# Patient Record
Sex: Male | Born: 1975
Health system: Southern US, Community
[De-identification: ages and names within clinical notes are randomized; demographics above are authoritative.]

## PROBLEM LIST (undated history)

## (undated) DIAGNOSIS — A630 Anogenital (venereal) warts: Secondary | ICD-10-CM

## (undated) DIAGNOSIS — B20 Human immunodeficiency virus [HIV] disease: Secondary | ICD-10-CM

## (undated) DIAGNOSIS — Z21 Asymptomatic human immunodeficiency virus [HIV] infection status: Secondary | ICD-10-CM

## (undated) DIAGNOSIS — I10 Essential (primary) hypertension: Secondary | ICD-10-CM

## (undated) DIAGNOSIS — B354 Tinea corporis: Secondary | ICD-10-CM

## (undated) DIAGNOSIS — R21 Rash and other nonspecific skin eruption: Secondary | ICD-10-CM

## (undated) DIAGNOSIS — B360 Pityriasis versicolor: Secondary | ICD-10-CM

## (undated) HISTORY — DX: Human immunodeficiency virus (HIV) disease: B20

## (undated) HISTORY — DX: Essential (primary) hypertension: I10

## (undated) HISTORY — DX: Tinea corporis: B35.4

## (undated) HISTORY — DX: Pityriasis versicolor: B36.0

## (undated) HISTORY — DX: Rash and other nonspecific skin eruption: R21

## (undated) HISTORY — DX: Asymptomatic human immunodeficiency virus (hiv) infection status: Z21

## (undated) HISTORY — PX: NO PAST SURGERIES: SHX2092

---

## 2008-01-08 ENCOUNTER — Emergency Department (HOSPITAL_COMMUNITY): Admission: EM | Admit: 2008-01-08 | Discharge: 2008-01-08 | Payer: Self-pay | Admitting: Emergency Medicine

## 2008-01-09 ENCOUNTER — Observation Stay (HOSPITAL_COMMUNITY): Admission: EM | Admit: 2008-01-09 | Discharge: 2008-01-10 | Payer: Self-pay | Admitting: Emergency Medicine

## 2008-01-12 ENCOUNTER — Ambulatory Visit (HOSPITAL_COMMUNITY): Admission: RE | Admit: 2008-01-12 | Discharge: 2008-01-12 | Payer: Self-pay | Admitting: Infectious Disease

## 2008-01-12 ENCOUNTER — Ambulatory Visit: Payer: Self-pay | Admitting: Infectious Disease

## 2008-01-12 ENCOUNTER — Encounter (INDEPENDENT_AMBULATORY_CARE_PROVIDER_SITE_OTHER): Payer: Self-pay | Admitting: Internal Medicine

## 2008-01-13 DIAGNOSIS — R748 Abnormal levels of other serum enzymes: Secondary | ICD-10-CM | POA: Insufficient documentation

## 2008-01-13 DIAGNOSIS — R079 Chest pain, unspecified: Secondary | ICD-10-CM

## 2008-01-13 DIAGNOSIS — J984 Other disorders of lung: Secondary | ICD-10-CM

## 2008-01-13 LAB — CONVERTED CEMR LAB
BUN: 10 mg/dL (ref 6–23)
CK-MB: 23.8 ng/mL — ABNORMAL HIGH (ref 0.3–4.0)
CO2: 29 meq/L (ref 19–32)
Calcium: 9.3 mg/dL (ref 8.4–10.5)
Chloride: 100 meq/L (ref 96–112)
Creatinine, Ser: 0.92 mg/dL (ref 0.40–1.50)
Glucose, Bld: 84 mg/dL (ref 70–99)
Potassium: 3.4 meq/L — ABNORMAL LOW (ref 3.5–5.3)
Relative Index: 2.1 (ref 0.0–2.5)
Sodium: 135 meq/L (ref 135–145)
Total CK: 1127 units/L — ABNORMAL HIGH (ref 7–232)
Troponin I: <0.01 ng/mL (ref <0.06)

## 2008-01-19 ENCOUNTER — Ambulatory Visit: Payer: Self-pay | Admitting: Internal Medicine

## 2008-01-19 ENCOUNTER — Encounter (INDEPENDENT_AMBULATORY_CARE_PROVIDER_SITE_OTHER): Payer: Self-pay | Admitting: Internal Medicine

## 2008-01-20 LAB — CONVERTED CEMR LAB
BUN: 20 mg/dL (ref 6–23)
CK-MB: 24.6 ng/mL — ABNORMAL HIGH (ref 0.3–4.0)
CO2: 29 meq/L (ref 19–32)
Calcium: 8.9 mg/dL (ref 8.4–10.5)
Chloride: 100 meq/L (ref 96–112)
Creatinine, Ser: 0.83 mg/dL (ref 0.40–1.50)
Glucose, Bld: 103 mg/dL — ABNORMAL HIGH (ref 70–99)
Potassium: 3.6 meq/L (ref 3.5–5.3)
Relative Index: 1.7 (ref 0.0–2.5)
Sodium: 137 meq/L (ref 135–145)
Total CK: 1465 units/L — ABNORMAL HIGH (ref 7–232)
Troponin I: <0.01 ng/mL (ref <0.06)

## 2008-01-28 ENCOUNTER — Ambulatory Visit: Payer: Self-pay | Admitting: Internal Medicine

## 2008-03-24 ENCOUNTER — Ambulatory Visit: Payer: Self-pay | Admitting: Internal Medicine

## 2008-03-24 ENCOUNTER — Encounter: Payer: Self-pay | Admitting: Internal Medicine

## 2008-03-28 LAB — CONVERTED CEMR LAB
ALT: 26 units/L (ref 0–53)
AST: 46 units/L — ABNORMAL HIGH (ref 0–37)
Albumin: 3.7 g/dL (ref 3.5–5.2)
Alkaline Phosphatase: 59 units/L (ref 39–117)
BUN: 15 mg/dL (ref 6–23)
CK-MB: 15.6 ng/mL — ABNORMAL HIGH (ref 0.3–4.0)
CO2: 28 meq/L (ref 19–32)
Calcium: 8.9 mg/dL (ref 8.4–10.5)
Chloride: 97 meq/L (ref 96–112)
Creatinine, Ser: 1 mg/dL (ref 0.40–1.50)
Glucose, Bld: 84 mg/dL (ref 70–99)
Potassium: 3.5 meq/L (ref 3.5–5.3)
Relative Index: 1.6 (ref 0.0–2.5)
Sodium: 132 meq/L — ABNORMAL LOW (ref 135–145)
Total Bilirubin: 0.7 mg/dL (ref 0.3–1.2)
Total CK: 947 units/L — ABNORMAL HIGH (ref 7–232)
Total Protein: 9 g/dL — ABNORMAL HIGH (ref 6.0–8.3)

## 2008-05-13 ENCOUNTER — Ambulatory Visit: Payer: Self-pay | Admitting: Internal Medicine

## 2008-05-13 ENCOUNTER — Encounter (INDEPENDENT_AMBULATORY_CARE_PROVIDER_SITE_OTHER): Payer: Self-pay | Admitting: Internal Medicine

## 2008-05-20 LAB — CONVERTED CEMR LAB
BUN: 10 mg/dL (ref 6–23)
CK-MB: 22.6 ng/mL — ABNORMAL HIGH (ref 0.3–4.0)
CO2: 26 meq/L (ref 19–32)
Calcium: 9.5 mg/dL (ref 8.4–10.5)
Chloride: 102 meq/L (ref 96–112)
Creatinine, Ser: 0.9 mg/dL (ref 0.40–1.50)
Glucose, Bld: 95 mg/dL (ref 70–99)
Potassium: 3.6 meq/L (ref 3.5–5.3)
Relative Index: 1.6 (ref 0.0–2.5)
Sodium: 137 meq/L (ref 135–145)
Total CK: 1392 units/L — ABNORMAL HIGH (ref 7–232)

## 2008-08-15 ENCOUNTER — Encounter (INDEPENDENT_AMBULATORY_CARE_PROVIDER_SITE_OTHER): Payer: Self-pay | Admitting: Internal Medicine

## 2008-08-15 ENCOUNTER — Ambulatory Visit: Payer: Self-pay | Admitting: Internal Medicine

## 2008-08-15 LAB — CONVERTED CEMR LAB
BUN: 31 mg/dL — ABNORMAL HIGH (ref 6–23)
CO2: 23 meq/L (ref 19–32)
Calcium: 8.8 mg/dL (ref 8.4–10.5)
Chloride: 98 meq/L (ref 96–112)
Creatinine, Ser: 1.17 mg/dL (ref 0.40–1.50)
Glucose, Bld: 82 mg/dL (ref 70–99)
Potassium: 3.9 meq/L (ref 3.5–5.3)
Sodium: 135 meq/L (ref 135–145)
Total CK: 2150 units/L — ABNORMAL HIGH (ref 7–232)

## 2009-07-10 ENCOUNTER — Ambulatory Visit: Payer: Self-pay | Admitting: Internal Medicine

## 2009-07-10 LAB — CONVERTED CEMR LAB
BUN: 13 mg/dL (ref 6–23)
CO2: 22 meq/L (ref 19–32)
Calcium: 8.8 mg/dL (ref 8.4–10.5)
Chloride: 101 meq/L (ref 96–112)
Creatinine, Ser: 0.94 mg/dL (ref 0.40–1.50)
Glucose, Bld: 86 mg/dL (ref 70–99)
Potassium: 3.8 meq/L (ref 3.5–5.3)
Sodium: 136 meq/L (ref 135–145)

## 2009-07-11 ENCOUNTER — Telehealth: Payer: Self-pay | Admitting: *Deleted

## 2009-08-10 IMAGING — CT CT ANGIO CHEST
2 of 5 series · 19 of 36 positions shown · IV contrast (100 ML OMNI 300)
Comparison: None available

CLINICAL DATA: Chest pain

CT ANGIOGRAPHY CHEST
TECHNIQUE: Multidetector CT imaging of the chest using the
standard protocol during bolus administration of intravenous
contrast. Multiplanar reconstructed images obtained and reviewed to
evaluate the vascular anatomy.
Contrast: 100 ml [id number] IV

[Series 3: pe · axial · 0.64mm/px · z∈[-362,-89]mm · 16 of 502 slices shown]
[im 24/502  lung]
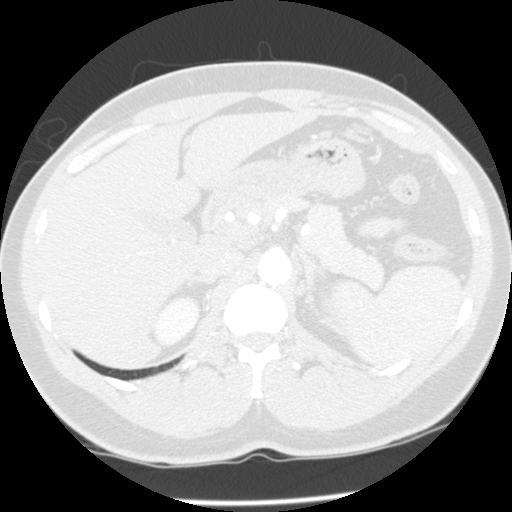
[im 48/502  mediastinal]
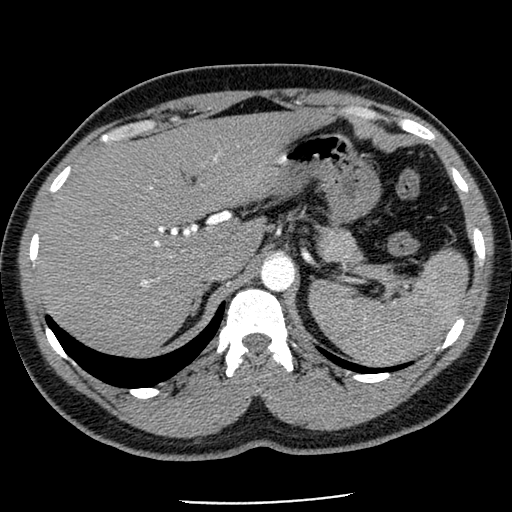
[im 96/502  lung]
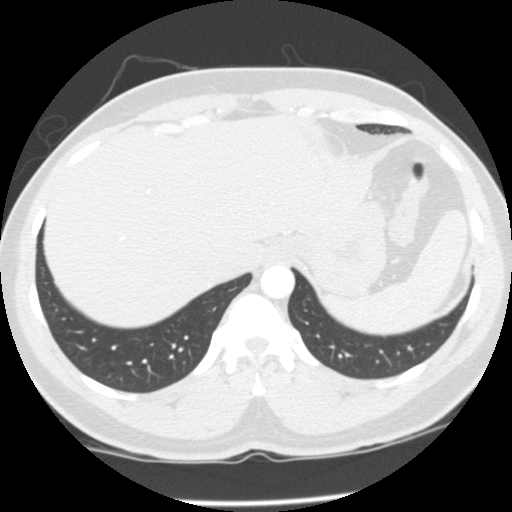
[im 120/502  mediastinal]
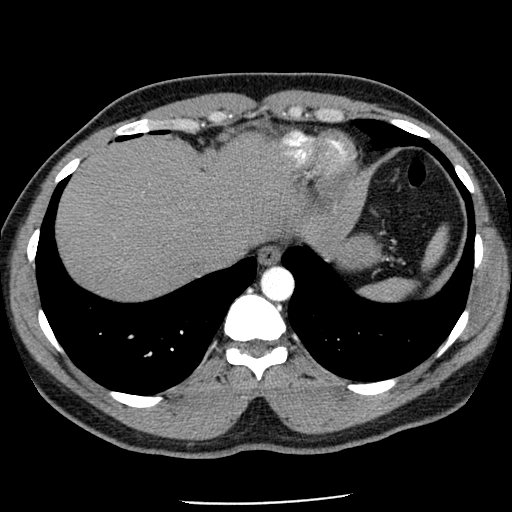
[im 144/502  lung]
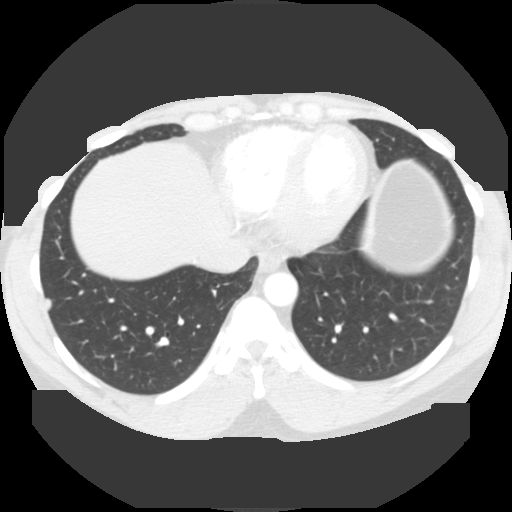
[im 168/502  mediastinal]
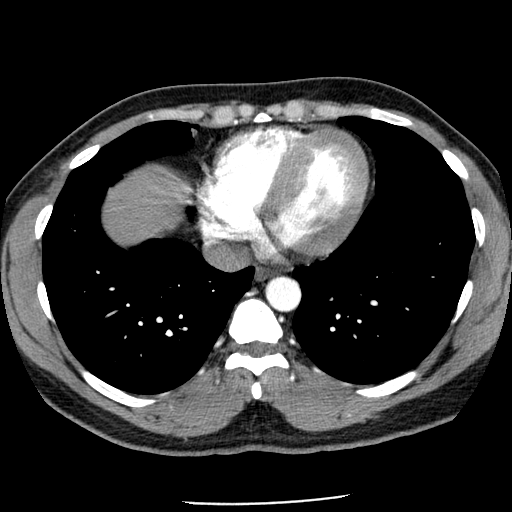
[im 215/502  lung]
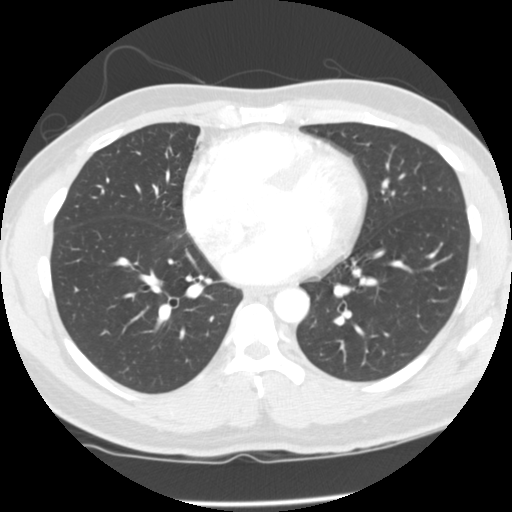
[im 239/502  mediastinal]
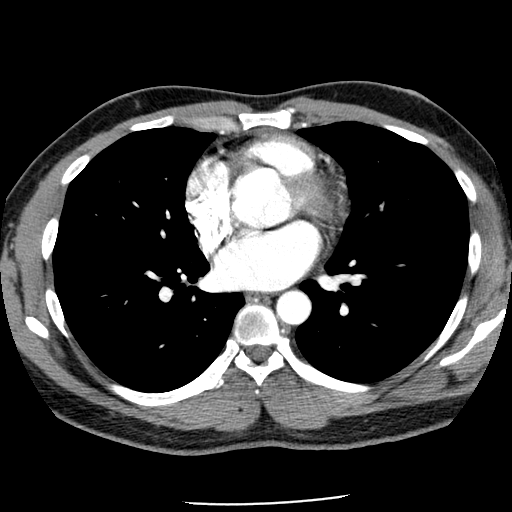
[im 263/502  lung]
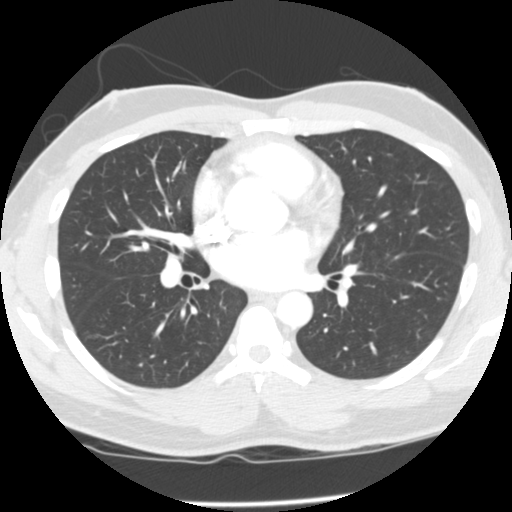
[im 287/502  mediastinal]
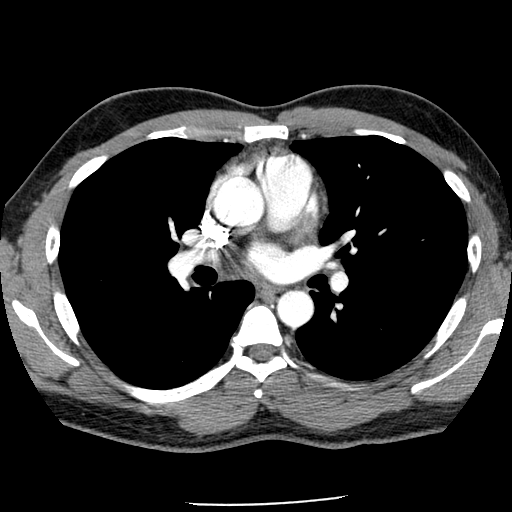
[im 335/502  lung]
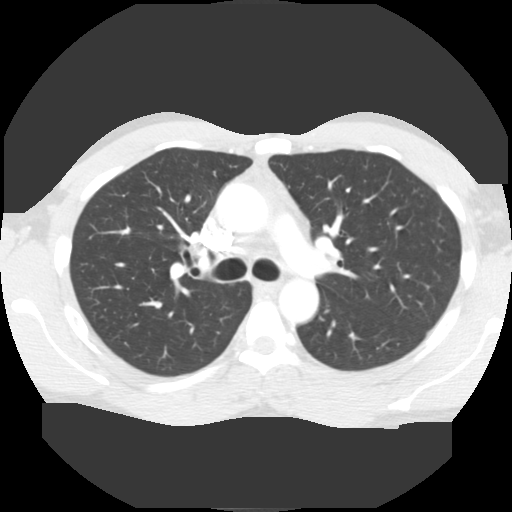
[im 358/502  mediastinal]
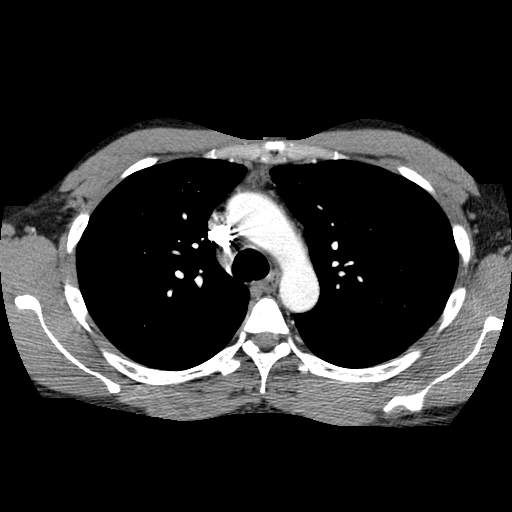
[im 382/502  lung]
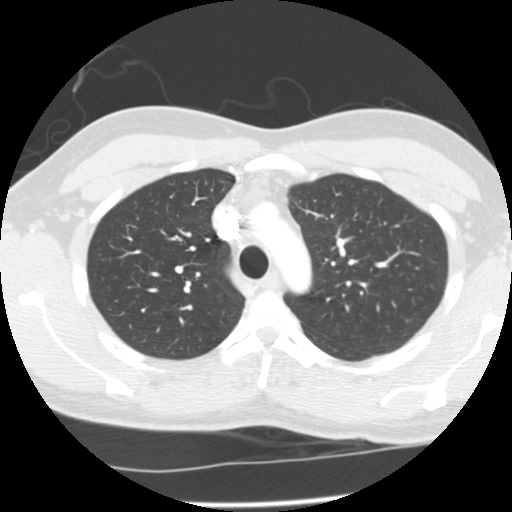
[im 406/502  mediastinal]
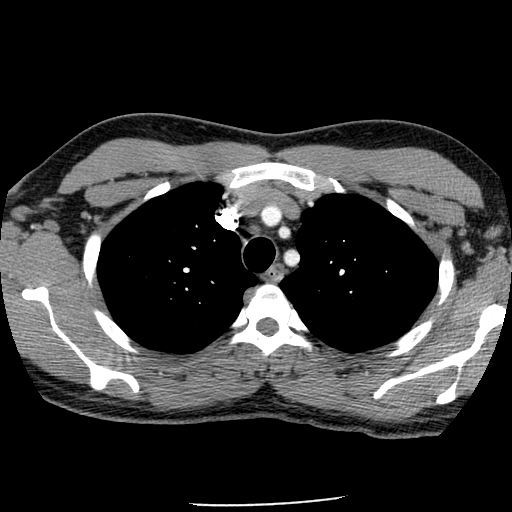
[im 454/502  lung]
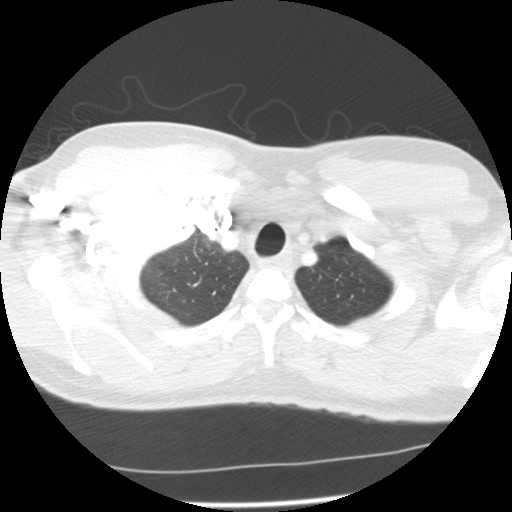
[im 478/502  mediastinal]
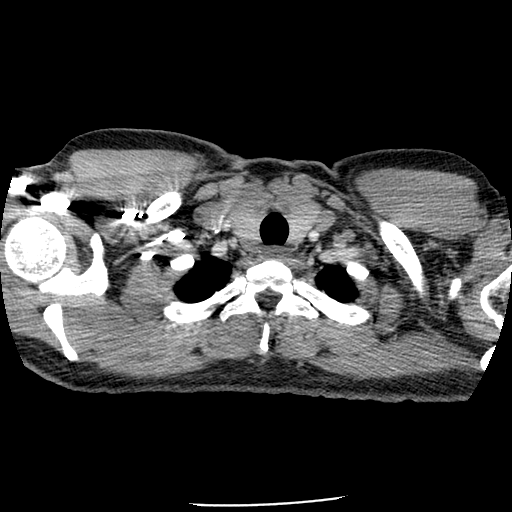

[Series 300: cor chest · coronal · 0.64mm/px · 3 of 152 slices shown]
[im 31/152  mediastinal]
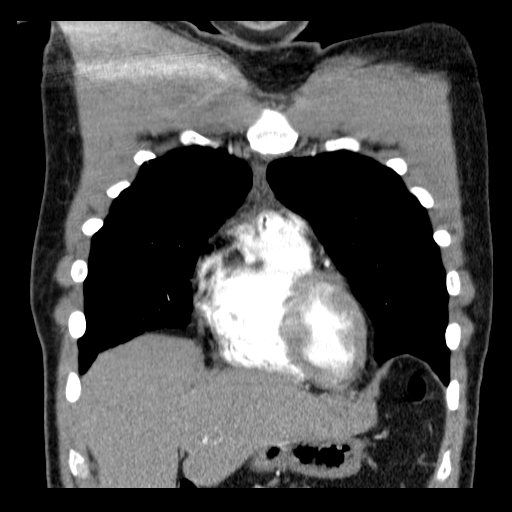
[im 61/152  mediastinal]
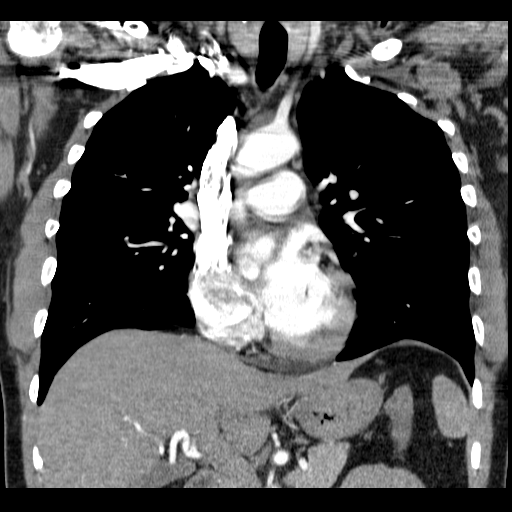
[im 91/152  mediastinal]
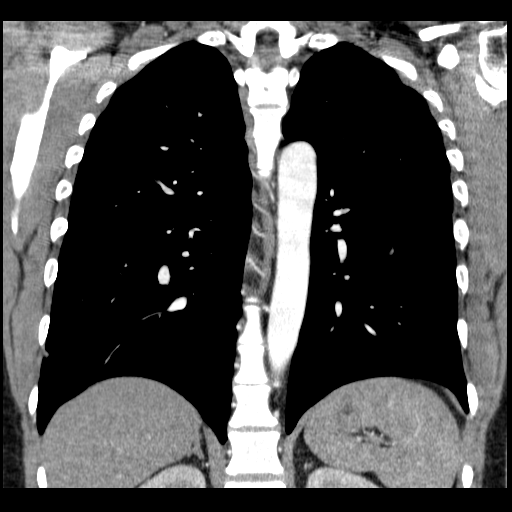

[19 of 36 positions shown; findings below may reference images not displayed]

FINDINGS: There is good contrast opacification of pulmonary artery
branches.  No discrete filling defects to suggest acute PE.  There
is good thoracic aortic opacification without evident abnormality.
No pleural or pericardial effusion.  Visualized upper abdomen
unremarkable.  No hilar or mediastinal adenopathy.  A few
subcentimeter prevascular lymph nodes are noted.  Nonspecific   6
mm pleural-based nodule in the lateral basal segment right lower
lobe.  Lungs otherwise clear.
IMPRESSION: 1.  Negative for acute PE.
2.  Nonspecific 6 mm pleural-based right lower lobe nodule.  In the
absence of any previous studies, consider 12-month follow-up to
confirm stability.

## 2010-01-24 ENCOUNTER — Ambulatory Visit: Payer: Self-pay | Admitting: Internal Medicine

## 2010-01-26 ENCOUNTER — Ambulatory Visit (HOSPITAL_COMMUNITY): Admission: RE | Admit: 2010-01-26 | Discharge: 2010-01-26 | Payer: Self-pay | Admitting: Internal Medicine

## 2010-01-26 ENCOUNTER — Encounter (INDEPENDENT_AMBULATORY_CARE_PROVIDER_SITE_OTHER): Payer: Self-pay | Admitting: Internal Medicine

## 2010-01-26 DIAGNOSIS — I10 Essential (primary) hypertension: Secondary | ICD-10-CM | POA: Insufficient documentation

## 2010-06-04 ENCOUNTER — Ambulatory Visit: Payer: Self-pay | Admitting: Internal Medicine

## 2010-06-04 ENCOUNTER — Encounter: Payer: Self-pay | Admitting: Internal Medicine

## 2010-06-04 DIAGNOSIS — R599 Enlarged lymph nodes, unspecified: Secondary | ICD-10-CM | POA: Insufficient documentation

## 2010-06-04 LAB — CONVERTED CEMR LAB
HCV Ab: NEGATIVE
Hep A IgM: NEGATIVE
Hep B C IgM: NEGATIVE
Hepatitis B Surface Ag: NEGATIVE
Sed Rate: 15 mm/hr (ref 0–16)
TSH: 0.95 microintl units/mL (ref 0.350–4.5)

## 2010-06-10 LAB — CONVERTED CEMR LAB
Basophils Absolute: 0.1 10*3/uL (ref 0.0–0.1)
Basophils Relative: 2 % — ABNORMAL HIGH (ref 0–1)
Eosinophils Absolute: 0.1 10*3/uL (ref 0.0–0.7)
Eosinophils Relative: 5 % (ref 0–5)
HCT: 40.2 % (ref 39.0–52.0)
HIV-1 antibody: POSITIVE — AB
HIV-2 Ab: NEGATIVE
HIV: REACTIVE
Hemoglobin: 13.7 g/dL (ref 13.0–17.0)
Lymphocytes Relative: 30 % (ref 12–46)
Lymphs Abs: 0.9 10*3/uL (ref 0.7–4.0)
MCHC: 34.1 g/dL (ref 30.0–36.0)
MCV: 81.4 fL (ref >=78.0)
Monocytes Absolute: 0.4 10*3/uL (ref 0.1–1.0)
Monocytes Relative: 12 % (ref 3–12)
Neutro Abs: 1.5 10*3/uL — ABNORMAL LOW (ref 1.7–7.7)
Neutrophils Relative %: 51 % (ref 43–77)
Platelets: 214 10*3/uL (ref 150–400)
RBC: 4.94 M/uL (ref 4.22–5.81)
RDW: 16.8 % — ABNORMAL HIGH (ref 11.5–15.5)
WBC: 2.9 10*3/uL — ABNORMAL LOW (ref 4.0–10.5)

## 2010-07-12 ENCOUNTER — Telehealth: Payer: Self-pay | Admitting: Internal Medicine

## 2010-07-12 ENCOUNTER — Ambulatory Visit: Payer: Self-pay | Admitting: Internal Medicine

## 2010-07-12 DIAGNOSIS — B20 Human immunodeficiency virus [HIV] disease: Secondary | ICD-10-CM | POA: Insufficient documentation

## 2010-07-13 ENCOUNTER — Telehealth: Payer: Self-pay | Admitting: Internal Medicine

## 2010-07-13 ENCOUNTER — Encounter: Payer: Self-pay | Admitting: Internal Medicine

## 2010-07-13 LAB — CONVERTED CEMR LAB
ALT: 30 units/L (ref 0–53)
AST: 73 units/L — ABNORMAL HIGH (ref 0–37)
Albumin: 4.2 g/dL (ref 3.5–5.2)
Alkaline Phosphatase: 60 units/L (ref 39–117)
BUN: 15 mg/dL (ref 6–23)
CO2: 26 meq/L (ref 19–32)
Calcium: 9.3 mg/dL (ref 8.4–10.5)
Chloride: 98 meq/L (ref 96–112)
Cholesterol: 114 mg/dL (ref 0–200)
Creatinine, Ser: 1.21 mg/dL (ref 0.40–1.50)
Glucose, Bld: 91 mg/dL (ref 70–99)
HDL: 25 mg/dL — ABNORMAL LOW (ref >39)
HIV 1 RNA Quant: 164000 copies/mL — ABNORMAL HIGH (ref <20)
HIV-1 RNA Quant, Log: 5.21 — ABNORMAL HIGH (ref <1.30)
Hep B S Ab: POSITIVE — AB
LDL Cholesterol: 75 mg/dL (ref 0–99)
Potassium: 3.5 meq/L (ref 3.5–5.3)
Sodium: 135 meq/L (ref 135–145)
Total Bilirubin: 0.5 mg/dL (ref 0.3–1.2)
Total CHOL/HDL Ratio: 4.6
Total Protein: 9.1 g/dL — ABNORMAL HIGH (ref 6.0–8.3)
Triglycerides: 68 mg/dL (ref <150)
VLDL: 14 mg/dL (ref 0–40)

## 2010-07-16 ENCOUNTER — Telehealth: Payer: Self-pay | Admitting: Internal Medicine

## 2010-07-19 ENCOUNTER — Ambulatory Visit: Payer: Self-pay | Admitting: Adult Health

## 2010-07-19 ENCOUNTER — Encounter: Payer: Self-pay | Admitting: Internal Medicine

## 2010-07-19 DIAGNOSIS — L219 Seborrheic dermatitis, unspecified: Secondary | ICD-10-CM | POA: Insufficient documentation

## 2010-07-19 LAB — CONVERTED CEMR LAB: Hep A Total Ab: NEGATIVE

## 2010-07-24 ENCOUNTER — Encounter: Payer: Self-pay | Admitting: Adult Health

## 2010-08-09 ENCOUNTER — Ambulatory Visit: Payer: Self-pay | Admitting: Adult Health

## 2010-08-09 ENCOUNTER — Encounter: Payer: Self-pay | Admitting: Internal Medicine

## 2010-08-09 DIAGNOSIS — M6282 Rhabdomyolysis: Secondary | ICD-10-CM

## 2010-08-09 DIAGNOSIS — B20 Human immunodeficiency virus [HIV] disease: Secondary | ICD-10-CM

## 2010-08-09 DIAGNOSIS — N179 Acute kidney failure, unspecified: Secondary | ICD-10-CM

## 2010-08-10 LAB — CONVERTED CEMR LAB
ALT: 61 units/L — ABNORMAL HIGH (ref 0–53)
AST: 111 units/L — ABNORMAL HIGH (ref 0–37)
Albumin: 3.6 g/dL (ref 3.5–5.2)
Alkaline Phosphatase: 42 units/L (ref 39–117)
BUN: 18 mg/dL (ref 6–23)
Basophils Absolute: 0 10*3/uL (ref 0.0–0.1)
Basophils Relative: 0 % (ref 0–1)
Bilirubin Urine: NEGATIVE
CO2: 25 meq/L (ref 19–32)
Calcium: 8.8 mg/dL (ref 8.4–10.5)
Casts: NONE SEEN /lpf
Chloride: 102 meq/L (ref 96–112)
Creatinine, Ser: 1.42 mg/dL (ref 0.40–1.50)
Creatinine, Urine: 249.7 mg/dL
Crystals: NONE SEEN
Eosinophils Absolute: 0 10*3/uL (ref 0.0–0.7)
Eosinophils Relative: 0 % (ref 0–5)
Glucose, Bld: 96 mg/dL (ref 70–99)
HCT: 37.3 % — ABNORMAL LOW (ref 39.0–52.0)
Hemoglobin: 13.2 g/dL (ref 13.0–17.0)
Ketones, ur: NEGATIVE mg/dL
Leukocytes, UA: NEGATIVE
Lymphocytes Relative: 30 % (ref 12–46)
Lymphs Abs: 1.2 10*3/uL (ref 0.7–4.0)
MCHC: 35.4 g/dL (ref 30.0–36.0)
MCV: 78.9 fL (ref 78.0–100.0)
Monocytes Absolute: 0.5 10*3/uL (ref 0.1–1.0)
Monocytes Relative: 12 % (ref 3–12)
Neutro Abs: 2.2 10*3/uL (ref 1.7–7.7)
Neutrophils Relative %: 58 % (ref 43–77)
Nitrite: NEGATIVE
Platelets: 270 10*3/uL (ref 150–400)
Potassium: 4.7 meq/L (ref 3.5–5.3)
Protein, ur: 30 mg/dL — AB
RBC: 4.73 M/uL (ref 4.22–5.81)
RDW: 16.9 % — ABNORMAL HIGH (ref 11.5–15.5)
Sodium: 136 meq/L (ref 135–145)
Specific Gravity, Urine: 1.021 (ref 1.005–1.030)
Squamous Epithelial / HPF: NONE SEEN /lpf
Total Bilirubin: 0.4 mg/dL (ref 0.3–1.2)
Total Protein, Urine: 54
Total Protein: 7.2 g/dL (ref 6.0–8.3)
Urine Glucose: NEGATIVE mg/dL
Urobilinogen, UA: 0.2 (ref 0.0–1.0)
WBC: 3.9 10*3/uL — ABNORMAL LOW (ref 4.0–10.5)
pH: 6 (ref 5.0–8.0)

## 2010-08-13 ENCOUNTER — Ambulatory Visit: Payer: Self-pay | Admitting: Adult Health

## 2010-08-22 ENCOUNTER — Telehealth: Payer: Self-pay | Admitting: Adult Health

## 2010-08-29 ENCOUNTER — Ambulatory Visit
Admission: RE | Admit: 2010-08-29 | Discharge: 2010-08-29 | Payer: Self-pay | Source: Home / Self Care | Attending: Adult Health | Admitting: Adult Health

## 2010-08-29 DIAGNOSIS — G609 Hereditary and idiopathic neuropathy, unspecified: Secondary | ICD-10-CM | POA: Insufficient documentation

## 2010-09-07 ENCOUNTER — Encounter (INDEPENDENT_AMBULATORY_CARE_PROVIDER_SITE_OTHER): Payer: Self-pay | Admitting: *Deleted

## 2010-09-14 ENCOUNTER — Ambulatory Visit
Admission: RE | Admit: 2010-09-14 | Discharge: 2010-09-14 | Payer: Self-pay | Source: Home / Self Care | Attending: Adult Health | Admitting: Adult Health

## 2010-09-14 ENCOUNTER — Encounter: Payer: Self-pay | Admitting: Adult Health

## 2010-09-14 LAB — CONVERTED CEMR LAB
Bilirubin Urine: NEGATIVE
Blood, UA: NEGATIVE
HIV 1 RNA Quant: 718 copies/mL — ABNORMAL HIGH (ref <20)
HIV-1 RNA Quant, Log: 2.86 — ABNORMAL HIGH (ref <1.30)
Ketones, ur: NEGATIVE mg/dL
Leukocytes, UA: NEGATIVE
Nitrite: NEGATIVE
Protein, ur: NEGATIVE mg/dL
Specific Gravity, Urine: 1.018 (ref 1.005–1.030)
Urine Glucose: NEGATIVE mg/dL
Urobilinogen, UA: 0.2 (ref 0.0–1.0)
pH: 6 (ref 5.0–8.0)

## 2010-09-17 LAB — CONVERTED CEMR LAB
ALT: 16 units/L (ref 0–53)
AST: 30 units/L (ref 0–37)
Albumin: 3.7 g/dL (ref 3.5–5.2)
Alkaline Phosphatase: 46 units/L (ref 39–117)
BUN: 13 mg/dL (ref 6–23)
Basophils Absolute: 0 10*3/uL (ref 0.0–0.1)
Basophils Relative: 1 % (ref 0–1)
CO2: 24 meq/L (ref 19–32)
Calcium: 9 mg/dL (ref 8.4–10.5)
Chloride: 105 meq/L (ref 96–112)
Creatinine, Ser: 1.67 mg/dL — ABNORMAL HIGH (ref 0.40–1.50)
Eosinophils Absolute: 0 10*3/uL (ref 0.0–0.7)
Eosinophils Relative: 0 % (ref 0–5)
Glucose, Bld: 73 mg/dL (ref 70–99)
HCT: 32.9 % — ABNORMAL LOW (ref 39.0–52.0)
Hemoglobin: 11.3 g/dL — ABNORMAL LOW (ref 13.0–17.0)
Lymphocytes Relative: 26 % (ref 12–46)
Lymphs Abs: 0.8 10*3/uL (ref 0.7–4.0)
MCHC: 34.3 g/dL (ref 30.0–36.0)
MCV: 79.9 fL (ref 78.0–100.0)
Monocytes Absolute: 0.4 10*3/uL (ref 0.1–1.0)
Monocytes Relative: 12 % (ref 3–12)
Neutro Abs: 1.9 10*3/uL (ref 1.7–7.7)
Neutrophils Relative %: 62 % (ref 43–77)
Platelets: 228 10*3/uL (ref 150–400)
Potassium: 4 meq/L (ref 3.5–5.3)
RBC: 4.12 M/uL — ABNORMAL LOW (ref 4.22–5.81)
RDW: 18 % — ABNORMAL HIGH (ref 11.5–15.5)
Sodium: 138 meq/L (ref 135–145)
Total Bilirubin: 0.2 mg/dL — ABNORMAL LOW (ref 0.3–1.2)
Total CK: 403 units/L — ABNORMAL HIGH (ref 7–232)
Total Protein: 7 g/dL (ref 6.0–8.3)
WBC: 3.1 10*3/uL — ABNORMAL LOW (ref 4.0–10.5)

## 2010-09-17 LAB — T-HELPER CELL (CD4) - (RCID CLINIC ONLY)
CD4 % Helper T Cell: 4 % — ABNORMAL LOW (ref 33–55)
CD4 T Cell Abs: 30 uL — ABNORMAL LOW (ref 400–2700)

## 2010-09-28 ENCOUNTER — Ambulatory Visit
Admission: RE | Admit: 2010-09-28 | Discharge: 2010-09-28 | Payer: Self-pay | Source: Home / Self Care | Attending: Adult Health | Admitting: Adult Health

## 2010-09-28 ENCOUNTER — Encounter: Payer: Self-pay | Admitting: Infectious Diseases

## 2010-10-02 NOTE — Assessment & Plan Note (Signed)
Summary: KNOT ON NECK/ SB.   Vital Signs:  Patient profile:   35 year old male Height:      67 inches (170.18 cm) Weight:      190.1 pounds (86.41 kg) BMI:     29.88 Temp:     98.6 degrees F (37.00 degrees C) oral Pulse rate:   100 / minute BP sitting:   139 / 98  (right arm)  Vitals Entered By: Stanton Kidney Ditzler RN (June 04, 2010 9:32 AM) Is Patient Diabetic? No Pain Assessment Patient in pain? yes     Location: knot under left ear Intensity: 5 Type: stabbing Onset of pain  past 2 weeks Nutritional Status BMI of 25 - 29 = overweight Nutritional Status Detail appetite good  Have you ever been in a relationship where you felt threatened, hurt or afraid?denies   Does patient need assistance? Functional Status Self care Ambulation Normal Comments Has had  knot under left ear past 2 weeks - also has knot under right ear and back of head.   History of Present Illness: 35 yr old man with pmhx as described below comes to the clinic complaining of having knot in the back of his neck. First one started 3 weeks ago and has gotten two others ones since then. Denies fever or chills. Tender to touch.   Depression History:      The patient denies a depressed mood most of the day and a diminished interest in his usual daily activities.         Preventive Screening-Counseling & Management  Alcohol-Tobacco     Alcohol drinks/day: <1     Alcohol type: 1-2 drinks per week     Smoking Status: quit     Year Quit: June 2008     Passive Smoke Exposure: no  Caffeine-Diet-Exercise     Does Patient Exercise: yes     Type of exercise: STAIRS     Times/week: 2  Problems Prior to Update: 1)  Acne Vulgaris, Facial  (ICD-706.1) 2)  Cpk, Abnormal  (ICD-790.5) 3)  Lung Nodule  (ICD-518.89) 4)  Chest Pain  (ICD-786.50) 5)  Essential Hypertension  (ICD-401.9)  Medications Prior to Update: 1)  Hydrochlorothiazide 25 Mg  Tabs (Hydrochlorothiazide) .... Take 1 Tablet By Mouth Once A Day 2)   Benzoyl Peroxide Cleansing 4 % Lotn (Benzoyl Peroxide) .... Apply To The Affected Area 2 - 3 Times A Day.  Current Medications (verified): 1)  Hydrochlorothiazide 25 Mg  Tabs (Hydrochlorothiazide) .... Take 1 Tablet By Mouth Once A Day  Allergies: No Known Drug Allergies  Past History:  Past Medical History: Last updated: 08/15/2008 HTN 01/2008. Acne Atypical chest pain May 09 Isolated raised CK, likely related to heavy manual work. Lung nodule detected May 09, follow up CT recommended in 1 year.    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  Risk Factors: Alcohol Use: <1 (06/04/2010) Exercise: yes (06/04/2010)  Risk Factors: Smoking Status: quit (06/04/2010) Passive Smoke Exposure: no (06/04/2010)  Social History: Reviewed history from 03/24/2008 and no changes required. 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  Physical Exam  General:  NAD Eyes:  No corneal or conjunctival inflammation noted. EOMI. Perrla.  Vision grossly normal. Mouth:  Oral mucosa and oropharynx without lesions or exudates.  Teeth in good repair. Neck:  anterior cervical lymphadenopathy bilaterally 4cm, occipital lymphadenopathy 3cm Lungs:  Normal respiratory effort, chest expands  symmetrically. Lungs are clear to auscultation, no crackles or wheezes. Heart:  RRR, no mrg Abdomen:  Bowel sounds positive,abdomen soft and non-tender without masses, organomegaly or hernias noted. Msk:  normal ROM.   Extremities:  no edema Neurologic:  No cranial nerve deficits noted. Station and gait are normal. Plantar reflexes are down-going bilaterally. DTRs are symmetrical throughout. Sensory, motor and coordinative functions appear intact.   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 No fever or chills to suggest bacterial infection although in the differential. Will referr patient to Dr. Ezzard Standing- ENT for biopsy as patient meets criteria. Follow up on labs and have patient return to clinic in 2 weeks for further evaluation.  Orders: T-CBC w/Diff ([id number]) T-HIV Antibody  (Reflex) ([id number]) Sed Rate (ESR)-[facility] [phone]) T-Hepatitis Acute Panel ([id number]) T-TSH ([id number])  Problem # 2:  ESSENTIAL HYPERTENSION (ICD-401.9) Elevated today but prior visits has been controlled on current regimen. Will follow up in two weeks and reasses.   His updated medication list for this problem includes:    Hydrochlorothiazide 25 Mg Tabs (Hydrochlorothiazide) .Marland Kitchen... Take 1 tablet by mouth once a day  BP today: 139/98 Prior BP: 112/80 (01/24/2010)  Labs Reviewed: K+: 3.8 (07/10/2009) Creat: : 0.94 (07/10/2009)     Complete Medication List: 1)  Hydrochlorothiazide 25 Mg Tabs (Hydrochlorothiazide) .... Take 1 tablet by mouth once a day  Patient Instructions: 1)  Please schedule a follow-up appointment in 2 weeks. 2)  You will be called with any abnormalities in the tests scheduled or performed today.  If you don't hear from [place] within a week from when the test was performed, you can assume that your test was normal.  Process Orders Not all tests in this order have been sent for requisitioning Pending tests not yet sent for requisitioning:     06/04/2010: [facility] -- T-Hepatitis Acute Panel [[id number]] (signed)     06/04/2010: [facility] -- T-TSH [phone] (signed) Order queued for requisitioning for [facility]: June 04, 2010 10:35 AM  Tests Sent for requisitioning (June 04, 2010 10:39 AM):     06/04/2010: [facility] -- T-CBC w/Diff [[id number]] (signed)     06/04/2010: [facility] -- T-HIV Antibody  (Reflex) [[id number]] (signed)    Prevention & Chronic  Care Immunizations   Influenza vaccine: Not documented   Influenza vaccine deferral: Deferred  (06/04/2010)    Tetanus booster: Not documented    Pneumococcal vaccine: Not documented  Other Screening   Smoking status: quit  (06/04/2010)  Hypertension   Last Blood Pressure: 139 / 98  (06/04/2010)   Serum creatinine: 0.94  (07/10/2009)   Serum potassium 3.8  (07/10/2009)    Hypertension flowsheet reviewed?: Yes   Progress toward BP goal: Deteriorated  Self-Management Support :   Personal Goals (by the next clinic visit) :      Personal blood pressure goal: 140/90  (06/04/2010)   Patient will work on the following items until the next clinic visit to reach self-care goals:     Medications and monitoring: take my medicines every day, check my blood pressure, bring all of my medications to every visit, weigh myself weekly  (06/04/2010)     Eating: drink diet soda or water instead of juice or soda, eat more vegetables, use fresh or frozen vegetables, eat baked foods instead of fried foods, eat fruit for snacks and desserts  (06/04/2010)  Activity: take a 30 minute walk every day  (06/04/2010)    Hypertension self-management support: Written self-care plan, Education handout, Resources for patients handout  (06/04/2010)   Hypertension self-care plan printed.   Hypertension education handout printed      Resource handout printed.  Process Orders Not all tests in this order have been sent for requisitioning Pending tests not yet sent for requisitioning:     06/04/2010: [facility] -- T-Hepatitis Acute Panel [[id number]] (signed)     06/04/2010: [facility] -- T-TSH [phone] (signed) Order queued for requisitioning for [facility]: June 04, 2010 10:35 AM  Tests Sent for requisitioning (June 04, 2010 10:39 AM):     06/04/2010: [facility] -- T-CBC w/Diff [[id number]] (signed)     06/04/2010: [facility] --  T-HIV Antibody  (Reflex) [[id number]] (signed)   Appended Document: KNOT ON NECK/ SB. Pt aware of appt with Dr Marvetta Gibbons 06/04/10 2:45PM per Dr Cena Benton.  Appended Document: Orders Update/Sed Rate    Clinical Lists Changes  Orders: Added new Test order of T-Sed Rate (Automated) [phone]) - Signed Added new Service order of Est. Patient Level III (34742) - Signed      Process Orders Check Orders Results:     [facility]: ABN not required for this insurance Tests Sent for requisitioning (June 04, 2010 1:13 PM):     06/04/2010: [facility] -- T-Sed Rate (Automated) [phone] (signed)

## 2010-10-02 NOTE — Progress Notes (Signed)
Summary: labs/ hla  Phone Note Call from Patient   Summary of Call: Pt calls to ask why the state dept of health called him, that they told him he needed to call his dr's office and they would tell him why the dept of health was notifying him concerning his labwork. spoke w/ dr Midwife, pt will come to clinic at 1315 today and labs will be discussed Initial call taken by: Marin Roberts RN,  July 12, 2010 10:54 AM  New Problems: HIV INFECTION (ICD-042)   New Problems: HIV INFECTION (ICD-042)  Process Orders Check Orders Results:     [facility]: ABN not required for this insurance Order queued for requisitioning for [facility]: July 12, 2010 1:19 PM Tests Sent for requisitioning (July 12, 2010 1:19 PM):     07/12/2010: [facility] -- T-HIV Viral Load [phone] (signed)     07/12/2010: [facility] -- T-CMP with Estimated GFR [[id number]] (signed)     07/12/2010: [facility] -- T-Hepatitis B Surface Antibody [[id number]] (signed)     07/12/2010: [facility] -- T-Lipid Profile [[id number]] (signed)     07/12/2010: [facility] -- T-HIV1 Quant rflx Ultra or Genotype [phone] (signed)

## 2010-10-02 NOTE — Assessment & Plan Note (Signed)
    Complete Medication List: 1)  Hydrochlorothiazide 25 Mg Tabs (Hydrochlorothiazide) .... Take 1 tablet by mouth once a day 2)  Benzoyl Peroxide Cleansing 4 % Lotn (Benzoyl peroxide) .... Apply to the affected area 2 - 3 times a day.

## 2010-10-02 NOTE — Progress Notes (Signed)
Phone Note Outgoing Call   Call placed by: Blanch Media MD,  July 13, 2010 3:23 PM Call placed to: Patient Summary of Call: No answer - left message.  CD4 50! Sent rx for bactrium and and azithro to pharmacy ([facility]) Has ID appt on [place]. When he calls back, we need to tell him his immune system is weak and needs to start these ABX to prevent infxn  Follow-up for Phone Call        Ronald [name] called back. I informed him of need for ABX. He has never had a bad rxn to ABX. He does not have insurance that cover meds. Sent RX to [facility] so that the bactrium would be 4$. Only Rx 2 azithro and asked him to talk to Rachel about financial assistance. Follow-up by: Blanch Media MD,  July 13, 2010 3:48 PM    New/Updated Medications: AZITHROMYCIN 600 MG TABS (AZITHROMYCIN) Take 2 pills by mouth on the same day every week. SULFAMETHOXAZOLE-TMP DS 800-160 MG TABS (SULFAMETHOXAZOLE-TRIMETHOPRIM) Take one pill by mouth once daily Prescriptions: SULFAMETHOXAZOLE-TMP DS 800-160 MG TABS (SULFAMETHOXAZOLE-TRIMETHOPRIM) Take one pill by mouth once daily  #32 x 2   Entered and Authorized by:   Blanch Media MD   Signed by:   Blanch Media MD on 07/13/2010   Method used:   Electronically to        [facility] [phone]* (retail)       [address]       [place], [place]  [address]       Ph: [phone]       Fax: [phone]   RxID:   [id number] AZITHROMYCIN 600 MG TABS (AZITHROMYCIN) Take 2 pills by mouth on the same day every week.  #2 x 0   Entered and Authorized by:   Blanch Media MD   Signed by:   Blanch Media MD on 07/13/2010   Method used:   Electronically to        [facility] [phone]* (retail)       [address]       [place], [place]  [address]       Ph: [phone]       Fax: [phone]   RxID:   [id number] SULFAMETHOXAZOLE-TMP DS 800-160 MG TABS (SULFAMETHOXAZOLE-TRIMETHOPRIM) Take one pill by mouth once daily  #32 x 2  Entered and Authorized by:   Blanch Media MD   Signed by:   Blanch Media MD on 07/13/2010   Method used:   [facility] [phone][facility] (retail)       [address]       [place], [place]  [id number]       Ph: [phone] or [phone]       Fax: [phone]   RxID:   [id number] AZITHROMYCIN 600 MG TABS (AZITHROMYCIN) Take 2 pills by mouth on the same day every week.  #8 x 2   Entered and Authorized by:   Blanch Media MD   Signed by:   Blanch Media MD on 07/13/2010   Method used:   [facility]        [facility]  [address] [phone]* (retail)       [address]       [place], [place]  [id number]  Ph: [phone] or [phone]       Fax: [phone]   RxID:   [id number]    Past History:  Past Medical History: HIV     Dx 10/11 with CD4 50, and VL      No AIDS defining illnesses known HTN      Dx 01/2008. Acne     Sees dermatologist and is on acne rx Atypical chest pain May 09     Pain is ongoing Lung nodule detected May 09     F/U CT May 2011 - Stable sub centimeter pleural-based nodules in both lungs

## 2010-10-02 NOTE — Assessment & Plan Note (Signed)
Summary: new 042/INTERNAL MEDICINE PT OF DR.Therapist, sports   Primary Care Daryana Whirley:  Ronald Deforest MD  CC:  meet new doc.  History of Present Illness: This 35 y/o African-American male comes to clinic for evaluation and treatment for newly diagnosed HIV in October 2011.  He was seen by his PCP in October 2011 for treatment of lymphadenopathy for which he was given antibiotics and had an HIV Ab test due to history of risk exposure (MSM).  He failed his first f/u visit but was contacted by [facility]. and instructed him to see his PCP ASAP.  Initial staging labs were performed by his PCP and he reports he was told his immune system was "very low."  He was subsequently started on TMP-SMX for PJP prophylaxis as well as Azithromycin at ascribed weekly doses for MAC prophylaxis and referred to this cinic for further evaluation and ongoing care.  He voices only c/o non-exertional fatigue at present.  He denies any knowledge of treat for known HIV sequela, and generally claims his state of health as "good" prior to the development of lymphadenitis.  He admits current adherence to his prophylaxis medications.  Preventive Screening-Counseling & Management  Alcohol-Tobacco     Smoking Status: quit   Current Allergies: No known allergies  Review of Systems General:  Complains of fatigue and weight loss; denies chills, fever, loss of appetite, malaise, sleep disorder, sweats, and weakness. Eyes:  Denies blurring, discharge, double vision, eye irritation, eye pain, halos, itching, light sensitivity, red eye, vision loss-1 eye, and vision loss-both eyes. ENT:  Complains of nasal congestion and postnasal drainage; denies decreased hearing, difficulty swallowing, ear discharge, earache, hoarseness, nosebleeds, ringing in ears, sinus pressure, and sore throat; Hx of chronic "sinus". CV:  Denies bluish discoloration of lips or nails, chest pain or discomfort, difficulty breathing at night, difficulty  breathing while lying down, fainting, fatigue, leg cramps with exertion, lightheadness, near fainting, palpitations, shortness of breath with exertion, swelling of feet, swelling of hands, and weight gain. Resp:  Denies chest discomfort, chest pain with inspiration, cough, coughing up blood, excessive snoring, hypersomnolence, morning headaches, pleuritic, shortness of breath, sputum productive, and wheezing. GI:  Denies abdominal pain, bloody stools, change in bowel habits, constipation, dark tarry stools, diarrhea, excessive appetite, gas, hemorrhoids, indigestion, loss of appetite, nausea, vomiting, vomiting blood, and yellowish skin color. GU:  Denies decreased libido, discharge, dysuria, erectile dysfunction, genital sores, hematuria, incontinence, nocturia, urinary frequency, and urinary hesitancy. MS:  Denies joint pain, joint redness, joint swelling, loss of strength, low back pain, mid back pain, muscle aches, muscle , cramps, muscle weakness, stiffness, and thoracic pain. Derm:  Denies changes in color of skin, changes in nail beds, dryness, excessive perspiration, flushing, hair loss, insect bite(s), itching, lesion(s), poor wound healing, and rash; c/o chronic "acne". Neuro:  Denies brief paralysis, difficulty with concentration, disturbances in coordination, falling down, headaches, inability to speak, memory loss, numbness, poor balance, seizures, sensation of room spinning, tingling, tremors, visual disturbances, and weakness. Psych:  Complains of depression; denies alternate hallucination ( auditory/visual), anxiety, easily angered, easily tearful, irritability, mental problems, panic attacks, sense of great danger, suicidal thoughts/plans, thoughts of violence, unusual visions or sounds, and thoughts /plans of harming others; Some depression with diagnosis, but states he is handling "things well". Endo:  Denies cold intolerance, excessive hunger, excessive thirst, excessive urination, heat  intolerance, polyuria, and weight change. Heme:  See HPI; Complains of enlarge lymph nodes; denies abnormal bruising, bleeding, fevers, pallor, and  skin discoloration. Allergy:  Complains of seasonal allergies; denies hives or rash, itching eyes, persistent infections, and sneezing. :  Complains of HIV exposure; denies EBV exposure, TB exposure, exposure to sick animals, exposure to sick people, exposure to unusual animals, exposure to small children, exposure to caves/spelunking, exposure to bats, exposure to hunting/wild game, exposure to stagnant or pond water, exposure to salt water, exposure to marine animals/shellfish, animal bites, cat scratches, tick bites, eating raw eggs, eating raw chicken, eating raw fish/shellfish, blood transfusion, ingestion of well water, water vapor exposure, history of needle use/puncture, history of antibiotic use (last 2 mo.), and history of travel.  Vital Signs:  Patient profile:   35 year old male Height:      67 inches Weight:      179.56 pounds BMI:     28.22 Temp:     98.2 degrees F oral Pulse rate:   106 / minute BP sitting:   121 / 82  (right arm) Cuff size:   regular  Vitals Entered By: Jimmy Footman, CMA (July 19, 2010 9:04 AM) CC: meet new doc Is Patient Diabetic? No   Physical Exam  General:  Well-developed,well-nourished,in no acute distress; alert,appropriate and cooperative throughout examination Head:  Normocephalic and atraumatic without obvious abnormalities. No apparent alopecia or balding. Eyes:  No corneal or conjunctival inflammation noted. EOMI. Perrla. Funduscopic exam benign, without hemorrhages, exudates or papilledema. Vision grossly normal. Ears:  External ear exam shows no significant lesions or deformities.  Otoscopic examination reveals both canals impacted with cerumen, but no bleeding or discharge noted. Hearing is grossly normal bilaterally. Nose:  External nasal examination shows no deformity or inflammation. Nasal  mucosa are pink and moist without lesions or exudates. Mouth:  Oral mucosa and oropharynx without lesions or exudates.  Fair dentition. Neck:  No deformities, masses, or tenderness noted. Chest Wall:  No deformities, masses, tenderness or gynecomastia noted. Lungs:  Normal respiratory effort, chest expands symmetrically. Lungs are clear to auscultation, no crackles or wheezes. Heart:  Normal rate and regular rhythm. S1 and S2 normal without gallop, murmur, click, rub or other extra sounds. Abdomen:  Bowel sounds positive,abdomen soft and non-tender without masses, organomegaly or hernias noted. Rectal:  No external abnormalities noted. Normal sphincter tone. No rectal masses or tenderness. Genitalia:  Testes bilaterally descended without nodularity, tenderness or masses. No scrotal masses or lesions. No penis lesions or urethral discharge. Prostate:  deferred Msk:  No deformity or scoliosis noted of thoracic or lumbar spine.   Pulses:  R and L carotid,radial,femoral,dorsalis pedis and posterior tibial pulses are full and equal bilaterally Extremities:  No clubbing, cyanosis, edema, or deformity noted with normal full range of motion of all joints.   Neurologic:  No cranial nerve deficits noted. Station and gait are normal. Plantar reflexes are down-going bilaterally. DTRs are symmetrical throughout. Sensory, motor and coordinative functions appear intact. Skin:  ruddy and seborrheic keratosis noted to face in a butterfly pattern around naso-labial folds.   Cervical Nodes:  R anterior LN enlarged, R posterior LN enlarged, L anterior LN enlarged, and L posterior LN enlarged.  All nontneder.   Axillary Nodes:  R axillary LN enlarged and L axillary LN enlarged, nontneder.   Inguinal Nodes:  R inguinal LN enlarged and L  inguinal LN enlarged, nontneder.   Psych:  Cognition and judgment appear intact. Alert and cooperative with normal attention span and concentration. No apparent delusions, illusions,  hallucinations   Impression & Recommendations:  Problem # 1:  HIV  INFECTION (ICD-042) CD4 50 @ 3% as of 07/13/2010 with HIV RNA of 164,000 copies/ml per PCR @ 5.21 log.  Inital presentation of HIV with significant advanced disease.  Agree with PCP and MAC prophylaxis as previously prescribed by PCP.  ARV therapy is warranted and should begin soon.  As he is currently clinically stable, we will ask him to return to clinic next week to discuss treatment and begin therapy at that time.  He was advised to review information from [email or web address] and [email or web address] and bring any questions he may have on his next visit.  We reviewed briefly HIV pathology and inherent health risks and we will further discuss this on his follow-up.  He is instructed to continue all his current medications as prescribed for now.  He acknowledged this and agreed with the plan.  Prevention for positives was also reviewed.   Orders: T-Hepatitis A Antibody ([id number])  Problem # 2:  SEBORRHEIC DERMATITIS (ICD-690.10) Assessment: New Common finding in advanced HIV.  Will prescribe ketoconazole cream 2% to be applied two times a day to affected areas.  Problem # 3:  ESSENTIAL HYPERTENSION (ICD-401.9) Stable.  Instructed to continue HCTZ therapy as prescribed by PCP. His updated medication list for this problem includes:    Hydrochlorothiazide 25 Mg Tabs (Hydrochlorothiazide) .Marland Kitchen... 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  While this is a common finding in HIV, it warrants observation as he was previously symptomatic.  Will need to determine if LAD increases.  LAD with hx of increasing CPK can imply lymphoma development another sequela of HIV.  At present we will monitor, biopsy is not warranted unless LAD increases. His updated medication list for this problem includes:    Azithromycin 600 Mg Tabs  (Azithromycin) .Marland Kitchen... Take 2 pills by mouth on the same day every week.    Sulfamethoxazole-tmp Ds 800-160 Mg Tabs (Sulfamethoxazole-trimethoprim) .Marland Kitchen... Take one pill by mouth once daily  Medications Added to Medication List This Visit: 1)  Ketoconazole 2 % Crea (Ketoconazole) .... Apply to affected area two times a day as directed  Other Orders: Flu Vaccine 72yrs + (14782) Admin 1st Vaccine (95621) Pneumococcal Vaccine (30865) Admin of Any Addtl Vaccine (78469) Prescriptions: KETOCONAZOLE 2 % CREA (KETOCONAZOLE) Apply to affected area two times a day as directed  #60 gm x 2   Entered by:   Jimmy Footman, CMA   Authorized by:   Talmadge Chad NP   Signed by:   Jimmy Footman, CMA on 07/19/2010   Method used:   Electronically to        [facility] [phone]* (retail)       [address]       [place], [place]  [address]       Ph: [phone]       Fax: [phone]   RxID:   [id number] AZITHROMYCIN 600 MG TABS (AZITHROMYCIN) Take 2 pills by mouth on the same day every week.  #2 x 3   Entered by:   Jimmy Footman, CMA   Authorized by:   Talmadge Chad NP   Signed by:   Jimmy Footman, CMA on 07/19/2010   Method used:   Electronically to        [facility] [phone]* (retail)       [address]       [place], [place]  [address]  Ph: [phone]       Fax: [phone]   RxID:   [id number]     Immunizations Administered:  Influenza Vaccine # 1:    Vaccine Type: Fluvax 3+    Site: left deltoid    Mfr: novartis    Dose: 0.5 ml    Route: IM    Given by: Jimmy Footman, CMA    Exp. Date: 12/02/2010    Lot #: [id number]    VIS given: 03/27/10 version given July 19, 2010.  Pneumonia Vaccine:    Vaccine Type: Pneumovax    Site: left deltoid    Mfr: Merck    Dose: 0.5 ml    Route: IM    Given by: Jimmy Footman, CMA    Exp. Date: 01/02/2012    Lot #: [id number]    VIS given: 08/07/09 version given July 19, 2010.  Flu Vaccine Consent Questions:    Do you have a history  of severe allergic reactions to this vaccine? no    Any prior history of allergic reactions to egg and/or gelatin? no    Do you have a sensitivity to the preservative Thimersol? no    Do you have a past history of Guillan-Barre Syndrome? no    Do you currently have an acute febrile illness? no    Have you ever had a severe reaction to latex? no    Vaccine information given and explained to patient? yes     Appended Document: incorrect Ruba Outen on ov 11.17.11  Deandrew Hoecker listed in office note of 11.17.11 is Traci Sermon not Blanch Media   Clinical Lists Changes

## 2010-10-02 NOTE — Assessment & Plan Note (Signed)
Summary: CHECKUP/SB.   Vital Signs:  Patient profile:   35 year old male Height:      67 inches (170.18 cm) Weight:      179.02 pounds (81.37 kg) BMI:     28.14 Temp:     98.7 degrees F (37.06 degrees C) oral Pulse rate:   98 / minute BP sitting:   112 / 80  (right arm)  Vitals Entered By: Angelina Ok RN (Jan 24, 2010 11:14 AM) Is Patient Diabetic? No Pain Assessment Patient in pain? no      Nutritional Status BMI of 25 - 29 = overweight  Have you ever been in a relationship where you felt threatened, hurt or afraid?No   Does patient need assistance? Functional Status Self care Ambulation Normal Comments 2 forms for doctor to do.   1 for insurance and the other for school.   History of Present Illness: Ronald [name] is in here for routine follow up. He has HTN on HCTZ, taking meds as prescribed and no problem. Wants refills on his meds. No other complaints today.   Depression History:      The patient denies a depressed mood most of the day and a diminished interest in his usual daily activities.         Preventive Screening-Counseling & Management  Alcohol-Tobacco     Alcohol drinks/day: <1     Alcohol type: 1-2 drinks per week     Smoking Status: quit     Year Quit: June 2008     Passive Smoke Exposure: no  Current Medications (verified): 1)  Hydrochlorothiazide 25 Mg  Tabs (Hydrochlorothiazide) .... Take 1 Tablet By Mouth Once A Day 2)  Benzoyl Peroxide Cleansing 4 % Lotn (Benzoyl Peroxide) .... Apply To The Affected Area 2 - 3 Times A Day.  Allergies (verified): No Known Drug Allergies  Past History:  Past Medical History: Last updated: 08/15/2008 HTN 01/2008. Acne Atypical chest pain May 09 Isolated raised CK, likely related to heavy manual work. Lung nodule detected May 09, follow up CT recommended in 1 year.    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  Risk  Factors: Alcohol Use: <1 (01/24/2010) Exercise: yes (08/15/2008)  Risk Factors: Smoking Status: quit (01/24/2010) Passive Smoke Exposure: no (01/24/2010)  Review of Systems      See HPI   Impression & Recommendations:  Problem # 1:  HYPERTENSION NEC (ICD-997.91) Well-controlled. Continue the current regimen.    Problem # 2:  LUNG NODULE (ICD-518.89) He had a lung nodule see on CT back in May 2009 ,which was to be followed with a repeat CT. Will get one today.   Orders: CT without Contrast (CT w/o contrast)  Complete Medication List: 1)  Hydrochlorothiazide 25 Mg Tabs (Hydrochlorothiazide) .... Take 1 tablet by mouth once a day 2)  Benzoyl Peroxide Cleansing 4 % Lotn (Benzoyl peroxide) .... Apply to the affected area 2 - 3 times a day.  Patient Instructions: 1)  Please schedule a follow-up appointment in 6 months. Prescriptions: HYDROCHLOROTHIAZIDE 25 MG  TABS (HYDROCHLOROTHIAZIDE) Take 1 tablet by mouth once a day  #30 Tablet x 11   Entered and Authorized by:   Zara Council MD   Signed by:   Zara Council MD on 01/24/2010   Method used:   Electronically to        [facility]* (retail)       [address]  [place], [place]  [address]       Ph: [phone]       Fax: [phone]   RxID:   [id number]   Prevention & Chronic Care Immunizations   Influenza vaccine: Not documented   Influenza vaccine deferral: Refused  (07/10/2009)    Tetanus booster: Not documented    Pneumococcal vaccine: Not documented  Other Screening   Smoking status: quit  (01/24/2010)     Vital Signs:  Patient profile:   35 year old male Height:      67 inches (170.18 cm) Weight:      179.02 pounds (81.37 kg) BMI:     28.14 Temp:     98.7 degrees F (37.06 degrees C) oral Pulse rate:   98 / minute BP sitting:   112 / 80  (right arm)  Vitals Entered By: Angelina Ok RN (Jan 24, 2010 11:14 AM)

## 2010-10-02 NOTE — Miscellaneous (Signed)
Summary: HIPAA Restrictions  HIPAA Restrictions   Imported By: Florinda Marker 07/24/2010 11:43:44  _____________________________________________________________________  External Attachment:    Type:   Image     Comment:   External Document  Appended Document: 2wk f/u [mkj] Labs reviewed:  sodium - 136 K+ - 4.7 BUN - 18 Creat. - 1.42 Alb - 3.6 GFR by C-G - 79.8 GFR by MDRD - 70.2 U/A with MOD blood and Protein of 30

## 2010-10-02 NOTE — Consult Note (Signed)
Summary: Narda Bonds MD  Narda Bonds MD   Imported By: Louretta Parma 06/26/2010 16:54:23  _____________________________________________________________________  External Attachment:    Type:   Image     Comment:   External Document

## 2010-10-02 NOTE — Progress Notes (Signed)
  Phone Note Outgoing Call   Call placed by: Blanch Media MD,  July 16, 2010 4:19 PM Call placed to: Patient Summary of Call: Left message to see if he tolerated the ABX for PCP and MAC prophylaxis.  Follow-up for Phone Call        I never heard back. Pt has his ID appt today. Follow-up by: Blanch Media MD,  July 19, 2010 8:27 AM

## 2010-10-02 NOTE — Assessment & Plan Note (Signed)
Summary: 1315 APPT PER DR Raley Novicki/PCP-ILLATH/HLA   Vital Signs:  Patient profile:   35 year old male Height:      67 inches Weight:      180.5 pounds Temp:     98 degrees F oral Pulse rate:   86 / minute Resp:     20 per minute BP sitting:   132 / 93  (right arm) CC: labs, health dept report Is Patient Diabetic? No Pain Assessment Patient in pain? no       Have you ever been in a relationship where you felt threatened, hurt or afraid?No  Domestic Violence Intervention denies all  Does patient need assistance? Functional Status Self care Ambulation Normal   CC:  labs and health dept report.  History of Present Illness: 35 yo male seen here in Oct for adenopathy. Labs were obtained and pt referred and seen same day by Dr Ezzard Standing who Rx ABX. Pts HIV was + and western blot was also +. He no showed his F/U appt and was called today by state dept of health and told he needed to go his his doctor ASAP but would not tell him why. So he presented today and I informed pt that his HIV was +.  Pt has had only one sexual partner and were together 10 yrs, the relationship ended 2 yrs ago but they are still roommates but sounded that they are not close. No sexual contacts in past 2 yrs. Did not routinely use condoms during the 10 yrs.   Pt ha never had a blood transfusion although did have a needlestick about one year ago. 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 He reported the stick but knows nothing of the F/U / testing. He no longer works there and isn't comfortable going back to ask about details.   He has never travelled out of [place]. No IV drug use. No former HIV tests.  He believes the LAD has decreased. No other sxs. Doesn't remeber recent viral type sxs  His mother lives locally and is supportive.   He is off work Quarry manager.  Allergies: No Known Drug Allergies  Past History:  Past Medical History: HIV     Dx 10/11 HTN      Dx  01/2008. 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 Lives with a roommate  Review of Systems General:  Complains of weight loss; denies chills, fever, loss of appetite, and sweats; wt loss was intentional. CV:  Complains of chest pain or discomfort; denies shortness of breath with exertion. Resp:  Complains of chest discomfort; denies cough and shortness of breath. GI:  Denies abdominal pain, constipation, diarrhea, indigestion, and loss of appetite. Derm:  Complains of lesion(s) and rash. Heme:  Denies enlarge lymph nodes.  Physical Exam  General:  alert, well-developed, well-nourished, and well-hydrated.   Head:  normocephalic, atraumatic, and no abnormalities observed.   Eyes:  vision grossly intact, pupils equal, and pupils round.   Ears:  R ear normal and L ear normal.   Nose:  no external deformity and no external erythema.   Neck:  supple, full ROM,  and no masses.   Lungs:  normal respiratory effort, no accessory muscle use, and normal breath sounds.   Heart:  normal rate, regular rhythm, no murmur, no gallop, and no rub.   Neurologic:  alert & oriented X3 and gait normal.   Skin:  acne on face Cervical Nodes:  R anterior LN enlarged and L anterior LN enlarged mild.   Psych:  Oriented X3, memory intact for recent and remote, normally interactive, good eye contact, not anxious appearing, and not depressed appearing.   Additional Exam:  The right side of his face (parotid gland region) appears swollen - less of a nasolabial fold than on the L.   Impression & Recommendations:  Problem # 1:  HIV INFECTION (ICD-042) Additional labs were obtained - VL, CD4, CMP, Hep B S Ab, genotype, FLP. I provided the pt with the tele # to the  ID clinic but I also called Selena Batten and asked her to call pt with the date and time of appt. He will be seen by Nida Boatman the NP at the ID clinic. I gave him ample time to ask questions but he seemed to be overwhelmed. I told him I didn't know when or how he was infected.  Orders: T-CD4 ([id number])  Problem # 2:  ESSENTIAL HYPERTENSION (ICD-401.9) BP elevated but pt under sig stress. Will watch for now.  His updated medication list for this problem includes:    Hydrochlorothiazide 25 Mg Tabs (Hydrochlorothiazide) .Marland Kitchen... 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 Obtain labs for now.  Complete Medication List: 1)  Hydrochlorothiazide 25 Mg Tabs (Hydrochlorothiazide) .... Take 1 tablet by mouth once a day   Orders Added: 1)  T-CD4 [phone]   Process Orders Check Orders Results:     [facility]: ABN not required for this insurance Order queued for requisitioning for [facility]: July 12, 2010 1:46 PM Tests Sent for requisitioning (July 12, 2010 3:28 PM):     07/12/2010: [facility] -- T-CD4 [phone] (signed)     Prevention & Chronic Care Immunizations   Influenza vaccine: Not documented   Influenza vaccine deferral: Deferred  (06/04/2010)    Tetanus booster: Not documented    Pneumococcal vaccine: Not documented  Other Screening   Smoking status: quit  (06/04/2010)  Hypertension   Last Blood Pressure: 132 / 93  (07/12/2010)   Serum creatinine: 0.94  (07/10/2009)   Serum potassium 3.8  (07/10/2009)  Self-Management Support :   Personal Goals (by the next clinic visit) :      Personal blood pressure goal: 140/90  (06/04/2010)   Hypertension self-management support: Written self-care plan, Education handout, Resources for patients handout  (06/04/2010)   Process Orders Check Orders Results:     [facility]: ABN not required for this insurance Order queued for requisitioning for [facility]: July 12, 2010 1:46 PM Tests Sent for requisitioning (July 12, 2010 3:28 PM):     07/12/2010: [facility] -- T-CD4 [id number] (signed)

## 2010-10-04 NOTE — Assessment & Plan Note (Signed)
Summary: 2wk f/u [mkj]   CC:  2 week follow up visit.  History of Present Illness: Discharged from [facility] s/p viral syndrome, ARF with hyponatremia, and rhabdomyolysis with signficant elevation in serum CPK.  Most symptoms resolved, according to records.  ARF with hyponatremia resolved with fluid resuscitation.  Complete w/u showed no hydronephrosis of kidneys, CT of abd was negative, and CXR showed no pathology.  He was seen by ID who determined symptoms and clinical syndrome may have all been associated with a viral syndrome.  Currently, he feels well, denies fever, chills, sweats, abdominal pain or weakness.  He states he is ready to begin ARV therapy and brought several questions regarding HIV and HIV treatment he gathered from reviewing the web sites suggested on his first visit.  Preventive Screening-Counseling & Management  Alcohol-Tobacco     Alcohol drinks/day: <1     Alcohol type: 1-2 drinks per week     Smoking Status: quit     Year Quit: June 2008     Passive Smoke Exposure: no  Caffeine-Diet-Exercise     Does Patient Exercise: no  Safety-Violence-Falls     Seat Belt Use: yes   Current Allergies: No known allergies  Review of Systems General:  Denies chills, fatigue, fever, loss of appetite, malaise, sleep disorder, sweats, weakness, and weight loss. Eyes:  Denies blurring, discharge, double vision, eye irritation, eye pain, halos, itching, light sensitivity, red eye, vision loss-1 eye, and vision loss-both eyes. ENT:  Denies decreased hearing, difficulty swallowing, ear discharge, earache, hoarseness, nasal congestion, nosebleeds, postnasal drainage, ringing in ears, sinus pressure, and sore throat. CV:  Denies bluish discoloration of lips or nails, chest pain or discomfort, difficulty breathing at night, difficulty breathing while lying down, fainting, fatigue, leg cramps with exertion, lightheadness, near fainting, palpitations, shortness of breath with exertion, swelling  of feet, swelling of hands, and weight gain. Resp:  Denies chest discomfort, chest pain with inspiration, cough, coughing up blood, excessive snoring, hypersomnolence, morning headaches, pleuritic, shortness of breath, sputum productive, and wheezing. GI:  Denies abdominal pain, bloody stools, change in bowel habits, constipation, dark tarry stools, diarrhea, excessive appetite, gas, hemorrhoids, indigestion, loss of appetite, nausea, vomiting, vomiting blood, and yellowish skin color. GU:  Denies decreased libido, discharge, dysuria, erectile dysfunction, genital sores, hematuria, incontinence, nocturia, urinary frequency, and urinary hesitancy. MS:  Denies joint pain, joint redness, joint swelling, loss of strength, low back pain, mid back pain, muscle aches, muscle , cramps, muscle weakness, stiffness, and thoracic pain. Derm:  Denies changes in color of skin, changes in nail beds, dryness, excessive perspiration, flushing, hair loss, insect bite(s), itching, lesion(s), poor wound healing, and rash; states "acne" cleared with the use of creams prescribed on last visit.Marland Kitchen Neuro:  Denies brief paralysis, difficulty with concentration, disturbances in coordination, falling down, headaches, inability to speak, memory loss, numbness, poor balance, seizures, sensation of room spinning, tingling, tremors, visual disturbances, and weakness. Psych:  Denies alternate hallucination ( auditory/visual), anxiety, depression, easily angered, easily tearful, irritability, mental problems, panic attacks, sense of great danger, suicidal thoughts/plans, thoughts of violence, unusual visions or sounds, and thoughts /plans of harming others.  Vital Signs:  Patient profile:   35 year old male Height:      67 inches (170.18 cm) Weight:      168.0 pounds (76.36 kg) BMI:     26.41 Temp:     97.5 degrees F (36.39 degrees C) oral Pulse rate:   89 / minute BP sitting:   118 /  85  (right arm)  Vitals Entered By: Kathi Simpers  [facility]) (August 09, 2010 9:21 AM) CC: 2 week follow up visit Pain Assessment Patient in pain? no      Nutritional Status BMI of 25 - 29 = overweight Nutritional Status Detail appetite is good per patient  Have you ever been in a relationship where you felt threatened, hurt or afraid?Unable to ask-friend in room   Does patient need assistance? Functional Status Self care Ambulation Normal   Physical Exam  General:  Well-developed,well-nourished,in no acute distress; alert,appropriate and cooperative throughout examination Head:  Normocephalic and atraumatic without obvious abnormalities. No apparent alopecia or balding. Eyes:  vision grossly intact, pupils equal, and pupils round.   Ears:  R ear normal and L ear normal.   Nose:  External nasal examination shows no deformity or inflammation. Nasal mucosa are pink and moist without lesions or exudates. Mouth:  Oral mucosa and oropharynx without lesions or exudates.  Teeth in good repair. Neck:  No deformities, masses, or tenderness noted. Chest Wall:  No deformities, masses, tenderness or gynecomastia noted. Lungs:  Normal respiratory effort, chest expands symmetrically. Lungs are clear to auscultation, no crackles or wheezes. Heart:  Normal rate and regular rhythm. S1 and S2 normal without gallop, murmur, click, rub or other extra sounds. Abdomen:  Bowel sounds positive,abdomen soft and non-tender without masses, organomegaly or hernias noted. Msk:  No deformity or scoliosis noted of thoracic or lumbar spine.   Pulses:  R and L carotid,radial,femoral,dorsalis pedis and posterior tibial pulses are full and equal bilaterally Extremities:  No clubbing, cyanosis, edema, or deformity noted with normal full range of motion of all joints.   Neurologic:  No cranial nerve deficits noted. Station and gait are normal. Plantar reflexes are down-going bilaterally. DTRs are symmetrical throughout. Sensory, motor and coordinative functions appear  intact. Skin:  Facial seborrheic keratosis cleared. Cervical Nodes:  R anterior LN enlarged and L anterior LN enlarged mild.   Axillary Nodes:  R axillary LN enlarged and L axillary LN enlarged.  Shoddy, nontender Inguinal Nodes:  R inguinal LN enlarged and L  inguinal LN enlarged.  Shoddy, nontender Psych:  Cognition and judgment appear intact. Alert and cooperative with normal attention span and concentration. No apparent delusions, illusions, hallucinations   Impression & Recommendations:  Problem # 1:  VIRAL INFECTION (ICD-079.99) Assessment Improved Resolving syndrome of unknown etiology, but appears he has recovered   Problem # 2:  RHABDOMYOLYSIS (ICD-728.88) May be associated with viral syndrome.  According to serial lab review, CPK is declining well.  No outward physical or subjective findings of muscle weaknees or pain.  Will continue to monitor his progress.  Problem # 3:  RENAL FAILURE, ACUTE (ICD-584.9) Repeat creatinine after hydration was 1.28 but serum Na was still low at 128.  We will continue to monitor this as well as obtaining chemistries today and we will manually calculate his GFR before we begin any prescribed ARV therapies to assure treatment safety.  ARF may have also been a secondary result of his rhabdomyolysis, which is also resolving. Orders: Est. Patient Level IV (81191) T-Urine Protein [id number]) T-Urinalysis [phone]) T-Urine Creatinine ([id number]) T-CBC w/Diff ([id number]) T-Comprehensive Metabolic Panel ([id number])  Problem # 4:  HIV INFECTION (ICD-042) We spent > 20 minutes discussing his questions regarding HIV and HIV treatment.  We provided clear distinctions between HIV and an AIDS diagnosis and how we make such diagnoses.  He acknowledged his understanding of this.  We also reviewed basic  HIV pathogenesis, mechanisms of action of various treatment strategies, and potential regimens we may use.  After careful discussion of ARV regimens, we  decided a regimen of Truvada and Isentress would be the safest, best tolerated, and efficacious given his current health.  He admits no problems with two times a day dosing of Isentress. However, due to his recent episode of ARF, we need to be certain renal function is optimal before starting a tenofovir/emtricitabine-based regimen, or whether dose modification may be necessary.  We will obtain repeat CBC, CMP and U/A to eval this and ask him to return Monday 08/13/2010 to start treatment.  He acknowledged his understanding of the rationale and the plan and agreed to this.  We informed him it is less likely there should be an issue, but the toxicities associated with regimens require [place] to be vigilant.  His updated medication list for this problem includes:    Azithromycin 600 Mg Tabs (Azithromycin) .Marland Kitchen... Take 2 pills by mouth on the same day every week.    Sulfamethoxazole-tmp Ds 800-160 Mg Tabs (Sulfamethoxazole-trimethoprim) .Marland Kitchen... Take one pill by mouth once daily  Orders: Est. Patient Level IV (43329)  Appended Document: 2wk f/u [mkj] Labs reviewed:  sodium - 136 K+ - 4.7 BUN - 18 Creat. - 1.42 Alb - 3.6 GFR by C-G - 79.8 GFR by MDRD - 70.2 U/A with MOD blood and Protein of 30  Appended Document: 2wk f/u [mkj] Lab review shows good GFR (>60), u/a shows still some blood and protein.  We will write Rx's for Isentress and Truvada at routine doses today, dispense a pill box and co-pay cards for him with planned repeat labs in 4 weeks and clinic f/u in 6 weeks.

## 2010-10-04 NOTE — Assessment & Plan Note (Signed)
Summary: cont. pain throughout holidays /dde   Primary Provider:  Almyra Deforest MD  CC:  pt. c/o biltateral foot pain, B/P elevated, pt. taken off HCTZ in hospital due to elevated sodium, and toes and fingers always cold.  History of Present Illness: Persistent pain to soles of feet after working on feet for extended periods of time.  Intermittent tingling and "coldness" to tips of toes and tips of fingers.  Has been having symptoms for past 2 weeks.  Denies any discoloration to digits of hands and feet   Denies any claudication symptoms.  Preventive Screening-Counseling & Management  Alcohol-Tobacco     Alcohol drinks/day: 0     Alcohol type: 1-2 drinks per week     Smoking Status: quit     Year Quit: June 2008     Passive Smoke Exposure: no  Caffeine-Diet-Exercise     Caffeine use/day: soda, tea 2 per day     Does Patient Exercise: no     Type of exercise: STAIRS     Times/week: 2  Hep-HIV-STD-Contraception     HIV Risk: no  Safety-Violence-Falls     Seat Belt Use: yes      Drug Use:  no.     Current Allergies (reviewed today): No known allergies  Review of Systems       The patient complains of difficulty walking.  The patient denies anorexia, fever, weight loss, weight gain, vision loss, decreased hearing, hoarseness, chest pain, syncope, dyspnea on exertion, peripheral edema, prolonged cough, headaches, hemoptysis, abdominal pain, melena, hematochezia, severe indigestion/heartburn, hematuria, muscle weakness, suspicious skin lesions, transient blindness, unusual weight change, abnormal bleeding, enlarged lymph nodes, and angioedema.    Vital Signs:  Patient profile:   35 year old male Height:      67 inches (170.18 cm) Weight:      161.8 pounds (73.55 kg) BMI:     25.43 Temp:     98.4 degrees F (36.89 degrees C) oral Pulse rate:   94 / minute BP sitting:   142 / 104  (left arm)  Vitals Entered By: Wendall Mola CMA [facility]) (August 29, 2010 3:20  PM) CC: pt. c/o biltateral foot pain, B/P elevated, pt. taken off HCTZ in hospital due to elevated sodium, toes and fingers always cold Is Patient Diabetic? No Pain Assessment Patient in pain? yes     Location: feet Intensity: 7 Type: throbbing Onset of pain  Constant Nutritional Status BMI of 25 - 29 = overweight Nutritional Status Detail appetite "less"  Have you ever been in a relationship where you felt threatened, hurt or afraid?Unable to ask   Does patient need assistance? Functional Status Self care Ambulation Normal Comments no missed doses of meds per pt.   Physical Exam  General:  Well-developed,well-nourished,in no acute distress; alert,appropriate and cooperative throughout examination Head:  Normocephalic and atraumatic without obvious abnormalities. No apparent alopecia or balding. Mouth:  Oral mucosa and oropharynx without lesions or exudates.  Teeth in good repair. Neck:  No deformities, masses, or tenderness noted. Lungs:  Normal respiratory effort, chest expands symmetrically. Lungs are clear to auscultation, no crackles or wheezes. Heart:  Normal rate and regular rhythm. S1 and S2 normal without gallop, murmur, click, rub or other extra sounds. Abdomen:  Bowel sounds positive,abdomen soft and non-tender without masses, organomegaly or hernias noted. Msk:  Plantar surfaces of feet are without identifiable arches.  Pain elicited on pressure to arch region of bil. feet. Pulses:  R and L carotid,radial,femoral,dorsalis pedis  and posterior tibial pulses are full and equal bilaterally Extremities:  No clubbing, cyanosis, edema, or deformity noted with normal full range of motion of all joints.   Neurologic:  alert & oriented X3, cranial nerves II-XII intact, strength normal in all extremities, sensation intact to pinprick, gait normal, DTRs symmetrical and normal, and decreased sensation to LT.   Skin:  Facial seborrheic keratosis cleared. Psych:  Cognition and  judgment appear intact. Alert and cooperative with normal attention span and concentration. No apparent delusions, illusions, hallucinations   Impression & Recommendations:  Problem # 1:  FLAT FOOT (ICD-734)  To obtain good-fitting shoes with arch supports or alternatively, obtain arch support inserts for shoes to both feet.  Also recommended obtaining support stockings to improve circulation if he is o n his feet for extended periods of time.  We will monitor his response to these interventions before proceding with further evaluations and interventions.  Orders: Est. 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  However, at present, it is only a mild annoyance to him, but if symptoms worsen, we will try tricyclic or neuroleptic therapy.  He is instructed to keep hius current follow-up schedule with labs to be drawn 2 weeks before his appointment.  Orders: Est. Patient Level III (66440)

## 2010-10-04 NOTE — Miscellaneous (Signed)
  Clinical Lists Changes  Observations: Added new observation of RWPARTICIP: Yes (09/07/2010 13:49)

## 2010-10-04 NOTE — Assessment & Plan Note (Signed)
Summary: Office Visit - Infectious Disease    Current Allergies: No known allergies   Medications Added to Medication List This Visit: 1)  Isentress 400 Mg Tabs (Raltegravir potassium) .... Take one (1) tablet every twelve (12) hours 2)  Truvada 200-300 Mg Tabs (Emtricitabine-tenofovir) .... Take one (1) tablet once a day  Other Orders: Future Orders: T-CBC w/Diff ([id number]) ... 09/10/2010 T-CD4SP ([facility]) (CD4SP) ... 09/10/2010 T-Comprehensive Metabolic Panel [id number]) ... 09/10/2010 T-HIV Viral Load [id number]) ... 09/10/2010 T-Urinalysis (81003-65000) ... 09/10/2010 TLB-CK Total Only(Creatine Kinase/CPK) (82550-CK) ... 09/10/2010  Patient Instructions: 1)  Take one Isentress 400mg  tablet every 12 hours 2)  Take one Truvada tablet once daily 3)  Return to clinic in 4 weeks for repeat labs 4)  Please schedule follow-up visit for 6 weeks. 5)  Call clinic if you are having any questions or issues. Prescriptions: TRUVADA 200-300 MG TABS (EMTRICITABINE-TENOFOVIR) Take one (1) tablet once a day  #30 x 5   Entered and Authorized by:   Talmadge Chad NP   Signed by:   Talmadge Chad NP on 08/13/2010   Method used:   Electronically to        [facility]. [phone]* (retail)       [address]       [place], [place]  [address]       Ph: [phone]       Fax: [phone]   RxID:   [id number] ISENTRESS 400 MG TABS (RALTEGRAVIR POTASSIUM) Take one (1) tablet every twelve (12) hours  #60 x 5   Entered and Authorized by:   Talmadge Chad NP   Signed by:   Talmadge Chad NP on 08/13/2010   Method used:   Electronically to        [facility]. [phone]* (retail)       [address]       [place], [place]  [address]       Ph: [phone]       Fax: [phone]   RxID:   [id number]

## 2010-10-04 NOTE — Progress Notes (Signed)
  Phone Note Outgoing Call Call back at [facility] Phone [phone]   Call placed by: Talmadge Chad NP,  August 22, 2010 5:12 PM Call placed to: Patient Action Taken: Phone Call Completed Details for Reason: Continuous aching pain from bilateral forearms to "ring fingers"  Pain persistent, 4/10, denies trauma. Summary of Call: As above, called him after receiving e-mail regarding pain in both forearms radiating to "ring fingers".  Recommended to start ibuprofen 600mg  by mouth every 6 hours with plenty hydration for now and check response in the next two days.  He was instructed to contact us next week if symptoms not relieved.  we will try tramadol with ibuprofen to see if he obtains relief.  Informed him this may have multiple factors, and we will try intervention and evaluation in a step-wise fashion.  He acknowledged this and agreed with plan of care.

## 2010-10-24 NOTE — Medication Information (Signed)
[facility]   Imported By: Florinda Marker 10/15/2010 18:16:28  _____________________________________________________________________  External Attachment:    Type:   Image     Comment:   External Document

## 2010-11-05 ENCOUNTER — Encounter: Payer: Self-pay | Admitting: Adult Health

## 2010-11-09 ENCOUNTER — Other Ambulatory Visit: Payer: Self-pay | Admitting: Adult Health

## 2010-11-09 ENCOUNTER — Other Ambulatory Visit (INDEPENDENT_AMBULATORY_CARE_PROVIDER_SITE_OTHER): Payer: Self-pay | Admitting: Adult Health

## 2010-11-09 ENCOUNTER — Encounter: Payer: Self-pay | Admitting: Adult Health

## 2010-11-09 DIAGNOSIS — B20 Human immunodeficiency virus [HIV] disease: Secondary | ICD-10-CM

## 2010-11-09 LAB — CBC WITH DIFFERENTIAL/PLATELET
Basophils Absolute: 0 10*3/uL (ref 0.0–0.1)
Basophils Relative: 0 % (ref 0–1)
Eosinophils Absolute: 0.2 10*3/uL (ref 0.0–0.7)
Eosinophils Relative: 5 % (ref 0–5)
HCT: 39.2 % (ref 39.0–52.0)
Hemoglobin: 13.7 g/dL (ref 13.0–17.0)
Lymphocytes Relative: 36 % (ref 12–46)
Lymphs Abs: 1.2 10*3/uL (ref 0.7–4.0)
MCH: 30 pg (ref 26.0–34.0)
MCHC: 34.9 g/dL (ref 30.0–36.0)
MCV: 86 fL (ref 78.0–100.0)
Monocytes Absolute: 0.3 10*3/uL (ref 0.1–1.0)
Monocytes Relative: 9 % (ref 3–12)
Neutro Abs: 1.7 10*3/uL (ref 1.7–7.7)
Neutrophils Relative %: 49 % (ref 43–77)
Platelets: 219 10*3/uL (ref 150–400)
RBC: 4.56 MIL/uL (ref 4.22–5.81)
RDW: 17.7 % — ABNORMAL HIGH (ref 11.5–15.5)
WBC: 3.4 10*3/uL — ABNORMAL LOW (ref 4.0–10.5)

## 2010-11-09 LAB — CONVERTED CEMR LAB
ALT: 14 units/L (ref 0–53)
AST: 21 units/L (ref 0–37)
Albumin: 4.2 g/dL (ref 3.5–5.2)
Alkaline Phosphatase: 60 units/L (ref 39–117)
BUN: 15 mg/dL (ref 6–23)
Basophils Absolute: 0 10*3/uL (ref 0.0–0.1)
Basophils Relative: 0 % (ref 0–1)
CO2: 23 meq/L (ref 19–32)
Calcium: 9.4 mg/dL (ref 8.4–10.5)
Chloride: 106 meq/L (ref 96–112)
Creatinine, Ser: 1.49 mg/dL (ref 0.40–1.50)
Eosinophils Absolute: 0.2 10*3/uL (ref 0.0–0.7)
Eosinophils Relative: 5 % (ref 0–5)
Glucose, Bld: 97 mg/dL (ref 70–99)
HCT: 39.2 % (ref 39.0–52.0)
HIV 1 RNA Quant: <20 copies/mL (ref <20)
HIV-1 RNA Quant, Log: <1.3 (ref <1.30)
Hemoglobin: 13.7 g/dL (ref 13.0–17.0)
Lymphocytes Relative: 36 % (ref 12–46)
Lymphs Abs: 1.2 10*3/uL (ref 0.7–4.0)
MCHC: 34.9 g/dL (ref 30.0–36.0)
MCV: 86 fL (ref 78.0–100.0)
Monocytes Absolute: 0.3 10*3/uL (ref 0.1–1.0)
Monocytes Relative: 9 % (ref 3–12)
Neutro Abs: 1.7 10*3/uL (ref 1.7–7.7)
Neutrophils Relative %: 49 % (ref 43–77)
Platelets: 219 10*3/uL (ref 150–400)
Potassium: 4.8 meq/L (ref 3.5–5.3)
RBC: 4.56 M/uL (ref 4.22–5.81)
RDW: 17.7 % — ABNORMAL HIGH (ref 11.5–15.5)
Sodium: 140 meq/L (ref 135–145)
Total Bilirubin: 0.4 mg/dL (ref 0.3–1.2)
Total Protein: 8 g/dL (ref 6.0–8.3)
WBC: 3.4 10*3/uL — ABNORMAL LOW (ref 4.0–10.5)

## 2010-11-09 LAB — T-HELPER CELL (CD4) - (RCID CLINIC ONLY)
CD4 % Helper T Cell: 6 % — ABNORMAL LOW (ref 33–55)
CD4 T Cell Abs: 70 uL — ABNORMAL LOW (ref 400–2700)

## 2010-11-10 LAB — COMPLETE METABOLIC PANEL WITH GFR
ALT: 14 U/L (ref 0–53)
AST: 21 U/L (ref 0–37)
Albumin: 4.2 g/dL (ref 3.5–5.2)
Alkaline Phosphatase: 60 U/L (ref 39–117)
BUN: 15 mg/dL (ref 6–23)
CO2: 23 mEq/L (ref 19–32)
Calcium: 9.4 mg/dL (ref 8.4–10.5)
Chloride: 106 mEq/L (ref 96–112)
Creat: 1.49 mg/dL (ref 0.40–1.50)
GFR, Est African American: >60 mL/min (ref >60)
GFR, Est Non African American: 54 mL/min — ABNORMAL LOW (ref >60)
Glucose, Bld: 97 mg/dL (ref 70–99)
Potassium: 4.8 mEq/L (ref 3.5–5.3)
Sodium: 140 mEq/L (ref 135–145)
Total Bilirubin: 0.4 mg/dL (ref 0.3–1.2)
Total Protein: 8 g/dL (ref 6.0–8.3)

## 2010-11-13 LAB — T-HELPER CELLS (CD4) COUNT (NOT AT ARMC)
CD4 % Helper T Cell: 3 % — ABNORMAL LOW (ref 33–55)
CD4 T Cell Abs: 50 uL — ABNORMAL LOW (ref 400–2700)

## 2010-11-16 ENCOUNTER — Telehealth: Payer: Self-pay | Admitting: Adult Health

## 2010-11-20 NOTE — Miscellaneous (Signed)
Summary: [facility]   Imported By: Florinda Marker 11/14/2010 14:18:40  _____________________________________________________________________  External Attachment:    Type:   Image     Comment:   External Document

## 2010-11-20 NOTE — Assessment & Plan Note (Signed)
Summary: 6wk f/u [mkj]   Primary Provider:  Almyra Deforest MD  CC:  follow-up visit.  History of Present Illness: Doing well, no major complaints.  Adherent with meds.  Still having some fatigue symptoms.  Preventive Screening-Counseling & Management  Alcohol-Tobacco     Alcohol drinks/day: 0     Smoking Status: quit     Year Quit: June 2008     Passive Smoke Exposure: no  Caffeine-Diet-Exercise     Caffeine use/day: soda, tea 2 per day     Does Patient Exercise: no     Type of exercise: STAIRS     Times/week: 2   Current Allergies: No known allergies  Review of Systems  The patient denies anorexia, fever, weight loss, weight gain, vision loss, decreased hearing, hoarseness, chest pain, syncope, dyspnea on exertion, peripheral edema, prolonged cough, headaches, hemoptysis, abdominal pain, melena, hematochezia, severe indigestion/heartburn, hematuria, incontinence, genital sores, muscle weakness, suspicious skin lesions, transient blindness, difficulty walking, depression, unusual weight change, abnormal bleeding, enlarged lymph nodes, angioedema, and testicular masses.    Vital Signs:  Patient profile:   35 year old male Weight:      164.75 pounds (74.89 kg) BMI:     25.90 Temp:     98.7 degrees F (37.06 degrees C) oral Pulse rate:   116 / minute BP sitting:   141 / 81  Vitals Entered By: Golden Circle RN (September 28, 2010 9:06 AM) CC: follow-up visit Is Patient Diabetic? No Pain Assessment Patient in pain? no      Nutritional Status BMI of 25 - 29 = overweight Nutritional Status Detail appetite is "I eat everything in sight"  Have you ever been in a relationship where you felt threatened, hurt or afraid?No   Does patient need assistance? Functional Status Self care Ambulation Normal Comments missed 6 wks when problem with pharmacy. no misses since   Physical Exam  General:  Well-developed,well-nourished,in no acute distress; alert,appropriate and  cooperative throughout examination Head:  Normocephalic and atraumatic without obvious abnormalities. No apparent alopecia or balding. Eyes:  vision grossly intact, pupils equal, and pupils round.   Ears:  R ear normal and L ear normal.   Nose:  External nasal examination shows no deformity or inflammation. Nasal mucosa are pink and moist without lesions or exudates. Mouth:  Oral mucosa and oropharynx without lesions or exudates.  Teeth in good repair. Neck:  No deformities, masses, or tenderness noted. Lungs:  Normal respiratory effort, chest expands symmetrically. Lungs are clear to auscultation, no crackles or wheezes. Heart:  Normal rate and regular rhythm. S1 and S2 normal without gallop, murmur, click, rub or other extra sounds. Abdomen:  Bowel sounds positive,abdomen soft and non-tender without masses, organomegaly or hernias noted. Msk:  Plantar surfaces of feet are without identifiable arches.  Pain elicited on pressure to arch region of bil. feet. Pulses:  R and L carotid,radial,femoral,dorsalis pedis and posterior tibial pulses are full and equal bilaterally Extremities:  No clubbing, cyanosis, edema, or deformity noted with normal full range of motion of all joints.   Neurologic:  alert & oriented X3, cranial nerves II-XII intact, strength normal in all extremities, sensation intact to pinprick, gait normal,  Skin:  Facial seborrheic keratosis cleared. Cervical Nodes:  R anterior LN enlarged and L anterior LN enlarged mild.   Axillary Nodes:  R axillary LN enlarged and L axillary LN enlarged.  Shoddy, nontender Inguinal Nodes:  R inguinal LN enlarged and L  inguinal LN enlarged.  Shoddy, nontender  Psych:  Cognition and judgment appear intact. Alert and cooperative with normal attention span and concentration. No apparent delusions, illusions, hallucinations   Impression & Recommendations:  Problem # 1:  HIV INFECTION (ICD-042) CD4 30 @ 4 % with VL 718 copies/ml.  Good virologic  response.  CD4 response slow, but expected for someone with a very low CD4 nadir.  Will CPM and monitor for now. Labs 6 weeks F/u in 2 months. His updated medication list for this problem includes:    Azithromycin 600 Mg Tabs (Azithromycin) .Marland Kitchen... Take 2 pills by mouth on the same day every week.    Sulfamethoxazole-tmp Ds 800-160 Mg Tabs (Sulfamethoxazole-trimethoprim) .Marland Kitchen... Take one pill by mouth once daily  Orders: Est. Patient Level III (99213)Future Orders: T-CBC w/Diff ([id number]) ... 11/09/2010 T-CD4SP ([facility]) (CD4SP) ... 11/09/2010 T-Comprehensive Metabolic Panel [id number]) ... 11/09/2010 T-HIV Viral Load [id number]) ... 11/09/2010

## 2010-11-23 ENCOUNTER — Encounter: Payer: Self-pay | Admitting: Adult Health

## 2010-11-23 ENCOUNTER — Ambulatory Visit (INDEPENDENT_AMBULATORY_CARE_PROVIDER_SITE_OTHER): Payer: Self-pay | Admitting: Adult Health

## 2010-11-23 DIAGNOSIS — Z79899 Other long term (current) drug therapy: Secondary | ICD-10-CM

## 2010-11-23 DIAGNOSIS — Z21 Asymptomatic human immunodeficiency virus [HIV] infection status: Secondary | ICD-10-CM

## 2010-11-23 DIAGNOSIS — B20 Human immunodeficiency virus [HIV] disease: Secondary | ICD-10-CM

## 2010-11-23 MED ORDER — SULFAMETHOXAZOLE-TRIMETHOPRIM 800-160 MG PO TABS: 1.0000 | ORAL_TABLET | Freq: Every day | ORAL | Status: DC

## 2010-11-23 NOTE — Progress Notes (Signed)
Subjective:    Patient ID: Ronald [name], male    DOB: 27-Sep-1975, 35 y.o.   MRN: [id number]  HPI Doing well, tolerating meds without problems.  Recently lost job, but just started a new position.  Does not have insurance coverage yet, but is scheduled to see Eligibility today.  Has only 6 days remaining of medications.    Review of Systems  Constitutional: Negative for fever, chills, diaphoresis, activity change, appetite change, fatigue and unexpected weight change.  HENT: Negative for hearing loss, ear pain, nosebleeds, congestion, sore throat, facial swelling, rhinorrhea, sneezing, drooling, mouth sores, trouble swallowing, neck pain, neck stiffness, dental problem, voice change, postnasal drip, sinus pressure, tinnitus and ear discharge.   Eyes: Negative for photophobia, pain, discharge, redness, itching and visual disturbance.  Respiratory: Negative for apnea, cough, choking, chest tightness, shortness of breath, wheezing and stridor.   Cardiovascular: Negative for chest pain, palpitations and leg swelling.  Gastrointestinal: Negative for nausea, vomiting, abdominal pain, diarrhea, constipation, blood in stool, abdominal distention, anal bleeding and rectal pain.  Genitourinary: Negative for dysuria, urgency, frequency, hematuria, flank pain, decreased urine volume, discharge, penile swelling, scrotal swelling, enuresis, difficulty urinating, genital sores, penile pain and testicular pain.  Musculoskeletal: Negative for myalgias, back pain, joint swelling, arthralgias and gait problem.  Skin: Negative for color change, pallor, rash and wound.  Neurological: Negative for dizziness, tremors, seizures, syncope, facial asymmetry, speech difficulty, weakness, light-headedness, numbness and headaches.  Hematological: Negative for adenopathy. Does not bruise/bleed easily.  Psychiatric/Behavioral: Negative for suicidal ideas, hallucinations, behavioral problems, confusion, sleep disturbance,  self-injury, dysphoric mood, decreased concentration and agitation. The patient is not nervous/anxious and is not hyperactive.        Objective:   Physical Exam  Constitutional: He is oriented to person, place, and time. He appears well-developed and well-nourished. No distress.  HENT:  Head: Normocephalic and atraumatic.  Right Ear: External ear normal.  Left Ear: External ear normal.  Nose: Nose normal.  Mouth/Throat: Oropharynx is clear and moist. No oropharyngeal exudate.  Eyes: Conjunctivae and EOM are normal. Pupils are equal, round, and reactive to light. Right eye exhibits no discharge. Left eye exhibits no discharge. No scleral icterus.  Neck: Normal range of motion. Neck supple. No JVD present. No tracheal deviation present. No thyromegaly present.  Cardiovascular: Normal rate, regular rhythm, normal heart sounds and intact distal pulses.  Exam reveals no gallop and no friction rub.   No murmur heard. Pulmonary/Chest: Effort normal and breath sounds normal. No stridor. No respiratory distress. He has no wheezes. He has no rales. He exhibits no tenderness.  Abdominal: Soft. Bowel sounds are normal.  Musculoskeletal: Normal range of motion. He exhibits no edema and no tenderness.  Lymphadenopathy:    He has cervical adenopathy.  Neurological: He is alert and oriented to person, place, and time. No cranial nerve deficit. Coordination normal.  Skin: Skin is warm and dry. No rash noted. He is not diaphoretic. No erythema. No pallor.  Psychiatric: He has a normal mood and affect. His behavior is normal. Judgment and thought content normal.          Assessment & Plan:  HIV:  CD4 70 @ 6% with VL <20 copies/ml.  Will continue present therapy and have him RTC for labs in 10 weeks with f/u in 3 months.  We will give him 1 month supply of his ARV regimen for now with the intent he will obtain insurance and/or complete eligibility application   He verbally acknowledged this and  agreed  with plan.

## 2010-11-29 NOTE — Progress Notes (Signed)
Summary: Request for info  Phone Note Call from Patient   Summary of Call: Pt called to request that we send information to his case manager [facility]. Release requested via fax and received.   I will send labs and office visits.   Tomasita Morrow RN  November 16, 2010 5:06 PM

## 2010-11-30 ENCOUNTER — Encounter (INDEPENDENT_AMBULATORY_CARE_PROVIDER_SITE_OTHER): Payer: Self-pay | Admitting: *Deleted

## 2010-12-04 NOTE — Miscellaneous (Signed)
  Clinical Lists Changes 

## 2010-12-18 ENCOUNTER — Other Ambulatory Visit: Payer: Self-pay | Admitting: Adult Health

## 2010-12-18 DIAGNOSIS — B20 Human immunodeficiency virus [HIV] disease: Secondary | ICD-10-CM

## 2010-12-18 MED ORDER — AZITHROMYCIN 600 MG PO TABS: 1200.0000 mg | ORAL_TABLET | ORAL | Status: DC

## 2010-12-18 MED ORDER — EMTRICITABINE-TENOFOVIR DF 200-300 MG PO TABS: 1.0000 | ORAL_TABLET | Freq: Every day | ORAL | Status: DC

## 2010-12-18 MED ORDER — SULFAMETHOXAZOLE-TRIMETHOPRIM 800-160 MG PO TABS: 1.0000 | ORAL_TABLET | Freq: Every day | ORAL | Status: DC

## 2010-12-18 MED ORDER — RALTEGRAVIR POTASSIUM 400 MG PO TABS: 400.0000 mg | ORAL_TABLET | Freq: Two times a day (BID) | ORAL | Status: DC

## 2011-01-15 NOTE — Discharge Summary (Signed)
Ronald [name], Ronald [name] NO.:  [id number]   MEDICAL RECORD NO.:  [id number]          PATIENT TYPE:  INP   LOCATION:  3735                         FACILITY:  [facility]   PHYSICIAN:  Madaline Guthrie, M.D.    DATE OF BIRTH:  01/27/1976   DATE OF ADMISSION:  2008/01/24  DATE OF DISCHARGE:  01/10/2008                               DISCHARGE SUMMARY   DISCHARGE DIAGNOSES:  1. Chest pain of uncertain etiology.  2. Elevated creatinine kinase.  3. Hypertension.  4. Right lung nodule.   DISCHARGE MEDICATIONS:  Hydrochlorothiazide 25 mg once daily by mouth.   DISPOSITION AND FOLLOWUP:  The patient will be followed up at the [facility].  He is advised to contact the clinic on  Tuesday, Jan 12, 2008, for a repeat blood test to make sure that his  creatinine kinase is trending down.  The patient is also advised to  contact [phone] for a formal followup appointment with the clinic.  The  patient will need long-term followup in terms of his blood pressure  control.  Also, followup examination and a possible scan of the chest  and/or scrotum is recommended for an incidental finding of right lung  nodule on his chest.   STUDIES:  A CT angio of chest on 01/24/2008, negative for acute  pulmonary embolism, nonspecific, 6-mm pleural-based right lower lobe  nodule.  In the absence of any previous studies, a 36-month followup is  recommended.   CONSULTATIONS:  No consultations were requested during this hospital  admission.   ADMISSION HISTORY AND PHYSICAL:  Ronald [name] is a 35 year old man with no  significant past medical history apart from hypertension and only risk  factor of previous history of smoking, came into the ED with chest pain.  Pain started 1 day prior to admission when he was about to go to bed  after his evening meal.  He described the pain as sharp, substernal,  pleuritic, and nonradiating 6/10.  The pain lasted all through the  night.  He  went to work in the morning and was found to have high blood  pressure at his work and hence was sent to the ED.  His pain started to  subside after he received aspirin in the ED.  He denied any fever, but  felt sweaty with the pain, denied any shortness of breath, palpitations,  loss of consciousness, syncope, history of DVT or pulmonary embolism,  cough, hemoptysis, or recent travel.   ADMISSION VITALS:  Temperature 97, blood pressure 147/99, pulse 79,  respiratory rate 17, oxygen saturation 99% on room air.   PHYSICAL EXAMINATION:  GENERAL:  NAD.  EYES:  EOMI.  PERRL.  No icterus.  No pallor.  ENT:  Moist mucous membranes.  Oropharynx clear.  NECK:  No JVD.  Neck was supple.  CHEST:  Bilaterally good air entry.  No added sounds.  CARDIOVASCULAR:  Regular rate and rhythm.  Positive heart sounds.  No  murmur, rubs, or gallops.  ABDOMEN:  Soft, nontender, nondistended.  Normal bowel sounds.  EXTREMITIES:  No edema.  No calf swelling.  GENITOURINARY:  No testicular swelling.  SKIN:  Normal turgor.  No rash.  LYMPH:  No lymphadenopathy.  MUSCULOSKELETAL:  No joint or spine tenderness or swelling.  NEURO:  Alert and oriented x3.  Cranial nerves II through XII intact.  No focal motor or sensory deficits.  PSYCH EXAMINATION:  Appropriate.   ADMISSION LABORATORY DATA:  Sodium 138, potassium 3.8, chloride 102, BUN  19, creatinine 1, blood glucose 84, hemoglobin 15, hematocrit 44, D-  dimer 1.76, troponin 0.01.   HOSPITAL COURSE:  1. Chest pain; this was of unclear etiology.  The patient was admitted      for observation and further workup on his chest pain.  In terms of      cardiac risk factors,  he was an ex-smoker and had hypertension. We      cycled his cardiac enzymes and obtained repeat electrocardiograms      which did not reveal any evidence of acute coronary syndrome.  He      was placed on aspirin, nitrates, and p.r.n. morphine.  His UDS was      negative.  His fasting lipid  panel revealed cholesterol of 108, TC      of 69, HDL of 25, and LDL of 69.  The patient also received workup      in the line of possible pulmonary embolism in the ED.  His CT angio      was negative as mentioned above.  Also, the CT ruled out any      possibility of pneumothorax or pneumonia.  It is possible that the      patient's chest pain is related to musculoskeletal as the patient      is subjected to heavy lifting in the line of his work.  2. Hypertension.  The patient was started on hydrochlorothiazide 25 mg      daily.  His UDS as mentioned above was negative.  3. Right lung nodule.  This was an incidental finding on the CT scan      of chest.  We did a testicular examination of the patient which did      not reveal any swelling in the testes.  It is recommended that the      patient get a repeat CT of the chest in about 1 year's time.  4. Elevated creatinine kinase.  Labs, white cells 3.9.  The patient      came in with creatinine kinase of 1584 which trended down to 1153      followed by 936, and the last one was 863.  It is possible that      this is related to muscular exertion on the part of the patient.      We simply monitored the patient and maintained him, kept him with      hydrated.  This was showing a downward trend, but we would like to      get a repeat CK measurement in about 2 days' time.   DISCHARGE LABS:  WBC 3.9, hemoglobin 13.1, platelet 214, sodium 135,  potassium 2.4, chloride 100, bicarbonate 26, glucose 70, BUN 13,  creatinine 0.8, bilirubin 0.9, alk phos 50, AST 55, ALT 32, protein 9.6,  albumin 3.8, calcium 9.3.   DISCHARGE VITALS:  Temperature 97.5, pulse 72, respirations 18, blood  pressure 124/87.   The patient is discharged to home in a stable condition.  On the day of  discharge, his vitals were within normal  limits, and he was no longer  complaining of any chest pain.  His chest examination was within normal  limits.      Ronald Council,  MD  Electronically Signed      Madaline Guthrie, M.D.  Electronically Signed    AS/MEDQ  D:  01/12/2008  T:  01/13/2008  Job:  [id number]

## 2011-01-23 ENCOUNTER — Encounter: Payer: Self-pay | Admitting: Internal Medicine

## 2011-02-07 ENCOUNTER — Other Ambulatory Visit: Payer: Self-pay | Admitting: Adult Health

## 2011-02-07 ENCOUNTER — Other Ambulatory Visit: Payer: Self-pay | Admitting: Infectious Diseases

## 2011-02-07 ENCOUNTER — Other Ambulatory Visit: Payer: Self-pay

## 2011-02-08 LAB — COMPLETE METABOLIC PANEL WITH GFR
ALT: 17 U/L (ref 0–53)
AST: 24 U/L (ref 0–37)
Albumin: 4.1 g/dL (ref 3.5–5.2)
Alkaline Phosphatase: 76 U/L (ref 39–117)
Potassium: 4.1 mEq/L (ref 3.5–5.3)
Sodium: 135 mEq/L (ref 135–145)
Total Protein: 7.5 g/dL (ref 6.0–8.3)

## 2011-02-08 LAB — T-HELPER CELL (CD4) - (RCID CLINIC ONLY)
CD4 % Helper T Cell: 9 % — ABNORMAL LOW (ref 33–55)
CD4 T Cell Abs: 110 uL — ABNORMAL LOW (ref 400–2700)

## 2011-02-08 LAB — LIPID PANEL
HDL: 35 mg/dL — ABNORMAL LOW (ref >39)
LDL Cholesterol: 70 mg/dL (ref 0–99)
VLDL: 27 mg/dL (ref 0–40)

## 2011-02-08 LAB — CBC WITH DIFFERENTIAL/PLATELET
Basophils Absolute: 0 10*3/uL (ref 0.0–0.1)
Basophils Relative: 1 % (ref 0–1)
Eosinophils Relative: 6 % — ABNORMAL HIGH (ref 0–5)
HCT: 44 % (ref 39.0–52.0)
Lymphocytes Relative: 36 % (ref 12–46)
MCHC: 34.1 g/dL (ref 30.0–36.0)
MCV: 85.6 fL (ref 78.0–100.0)
Monocytes Absolute: 0.3 10*3/uL (ref 0.1–1.0)
RDW: 16.1 % — ABNORMAL HIGH (ref 11.5–15.5)

## 2011-02-08 LAB — HIV-1 RNA QUANT-NO REFLEX-BLD: HIV 1 RNA Quant: <20 copies/mL (ref <20)

## 2011-02-13 ENCOUNTER — Telehealth: Payer: Self-pay | Admitting: *Deleted

## 2011-02-13 NOTE — Telephone Encounter (Signed)
He had left a message asking for his lab results. I called him back & left message that he will discuss with NP at his office visit coming up

## 2011-02-21 ENCOUNTER — Encounter: Payer: Self-pay | Admitting: Adult Health

## 2011-02-21 ENCOUNTER — Ambulatory Visit (INDEPENDENT_AMBULATORY_CARE_PROVIDER_SITE_OTHER): Payer: Self-pay | Admitting: Adult Health

## 2011-02-21 VITALS — BP 137/95 | HR 82 | Temp 97.9°F | Ht 67.0 in | Wt 181.0 lb

## 2011-02-21 DIAGNOSIS — A630 Anogenital (venereal) warts: Secondary | ICD-10-CM

## 2011-02-21 NOTE — Progress Notes (Signed)
  Subjective:    Patient ID: Ronald [name], male    DOB: 1976/01/06, 35 y.o.   MRN: [id number]  HPI Presents to clinic for scheduled followup. Endorses adherence to antiretrovirals with good tolerance and no complications. Presents with complaints today of "bumps" to his perirectal region. Denies pain, difficulty defecating, pruritus, or bleeding.    Review of Systems  Constitutional: Negative.   HENT: Negative.   Eyes: Negative.   Respiratory: Negative.   Cardiovascular: Negative.   Gastrointestinal:       Rectal "bumps." As described in history of present illness  Genitourinary: Negative.   Musculoskeletal: Negative.   Skin: Negative.   Neurological: Negative.   Hematological: Negative.   Psychiatric/Behavioral: Negative.        Objective:   Physical Exam  Constitutional: He is oriented to person, place, and time. He appears well-developed and well-nourished. No distress.  HENT:  Head: Normocephalic and atraumatic.  Right Ear: External ear normal.  Left Ear: External ear normal.  Nose: Nose normal.  Mouth/Throat: Oropharynx is clear and moist.  Eyes: Conjunctivae and EOM are normal. Pupils are equal, round, and reactive to light. Right eye exhibits no discharge. Left eye exhibits no discharge.  Neck: Normal range of motion. Neck supple.  Cardiovascular: Normal rate and regular rhythm.   Pulmonary/Chest: Effort normal and breath sounds normal.  Abdominal: Soft. Bowel sounds are normal.  Genitourinary:       Multiple condylomatous lesions noted in the perianal region. Many are small, but some are grouped in clusters. They are not friable, and do not appear inflamed.  Neurological: He is alert and oriented to person, place, and time. No cranial nerve deficit. He exhibits normal muscle tone. Coordination normal.  Skin: Skin is warm and dry.  Psychiatric: He has a normal mood and affect. His behavior is normal. Judgment and thought content normal.          Assessment &  Plan:   1. HIV. Labs obtained 02/07/2011 show a CD4 count of 110. At 9% with a viral load of less than 20 copies/mL. Clinically stable on current regimen. Recommend continuing present management, repeating labs in 10 weeks with a followup in 3 months.  2. Perianal Condyloma Acuminatum. He is scheduled within the next month to receive health insurance benefits from his job. Given that his CD4 count is only 110, it might be better. If we were to wait to see if there is any other additional improvement in CD4 activity before we refer him to a surgeon for excision of his perianal lesions. We will then await restaging labs as scheduled and outlined above and make the referral on his next clinic visit. Self care of. Anal condyloma was discussed in detail.  He verbally acknowledged all information that was provided to him and agreed with plan of care.

## 2011-03-14 ENCOUNTER — Encounter: Payer: Self-pay | Admitting: Internal Medicine

## 2011-03-19 ENCOUNTER — Ambulatory Visit: Payer: Self-pay

## 2011-04-08 ENCOUNTER — Telehealth: Payer: Self-pay | Admitting: *Deleted

## 2011-04-08 NOTE — Telephone Encounter (Signed)
States he has poison oak on ankles & now hands. Transferred to front to make an appt. He does not have a PCP

## 2011-04-09 ENCOUNTER — Ambulatory Visit (INDEPENDENT_AMBULATORY_CARE_PROVIDER_SITE_OTHER): Payer: Managed Care, Other (non HMO) | Admitting: Adult Health

## 2011-04-09 ENCOUNTER — Other Ambulatory Visit: Payer: Self-pay | Admitting: *Deleted

## 2011-04-09 ENCOUNTER — Encounter: Payer: Self-pay | Admitting: Adult Health

## 2011-04-09 ENCOUNTER — Encounter: Payer: Self-pay | Admitting: *Deleted

## 2011-04-09 VITALS — BP 142/97 | HR 77 | Temp 98.1°F | Wt 190.8 lb

## 2011-04-09 DIAGNOSIS — T148XXA Other injury of unspecified body region, initial encounter: Secondary | ICD-10-CM

## 2011-04-09 DIAGNOSIS — L219 Seborrheic dermatitis, unspecified: Secondary | ICD-10-CM | POA: Insufficient documentation

## 2011-04-09 DIAGNOSIS — R238 Other skin changes: Secondary | ICD-10-CM | POA: Insufficient documentation

## 2011-04-09 MED ORDER — KETOCONAZOLE 2 % EX CREA: 1.0000 "application " | TOPICAL_CREAM | Freq: Two times a day (BID) | CUTANEOUS | Status: DC

## 2011-04-09 NOTE — Progress Notes (Signed)
  Subjective:    Patient ID: Ronald [name], male    DOB: 19-Jan-1976, 35 y.o.   MRN: [id number]  HPI Presents to clinic today with a ten-day to two-week history of recurring blisters to the inner aspects of both of his ankles and one isolated blister on his left wrist.. Endorses a history of exposure to poison oak over 3 weeks ago, but stated that that exposure, and the symptoms subsided long before these new blisters developed. He denies any infectious exposure, changes in detergents or soaps, or clothing. He states that the blisters are painless nonpruritic and when rupture, there is no further discharge or associated pain or tenderness. He also relates that he had a work-related injury to his left wrist, whereby he is now wearing a splint on that wrist and forearm.   Review of Systems  Constitutional: Negative for fever, chills and diaphoresis.  Eyes: Negative.   Respiratory: Negative.   Cardiovascular: Negative.   Gastrointestinal: Negative.   Genitourinary: Negative.   Musculoskeletal: Positive for myalgias and arthralgias.  Skin: Positive for rash and wound.       As per history of present illness  Neurological: Negative for dizziness, syncope, facial asymmetry, light-headedness and headaches.  Hematological: Does not bruise/bleed easily.  Psychiatric/Behavioral: Negative.        Objective:   Physical Exam  Constitutional: He is oriented to person, place, and time. He appears well-developed. No distress.       Overweight appearing  HENT:  Head: Normocephalic and atraumatic.  Eyes: Conjunctivae are normal. Pupils are equal, round, and reactive to light.  Neck: Normal range of motion. Neck supple.  Musculoskeletal: Normal range of motion.  Neurological: He is alert and oriented to person, place, and time. No cranial nerve deficit. He exhibits normal muscle tone. Coordination normal.  Skin:     Psychiatric: He has a normal mood and affect. His behavior is normal. Judgment and  thought content normal.          Assessment & Plan:  Blister Formation to Lower Legs and Left Wrist.  The presentation of these lesions are rather peculiar in their pattern and character. The are not consistent both physically and symptomatically with herpetic infections or bacterial infections as they are so isolated to very specific areas. They appear perhaps contact like in nature, but still remains very atypical and peculiar. Patient was also evaluated by Dr. Orvan Falconer who concurred with our interpretation, and given the do not believe these to be infectious, a referral to a dermatologist is in order for a possible evaluation, biopsy, and recommendations. In management. Consult was written and referral was made.  He was instructed to keep the areas clean and not to rupture the blisters manually. He verbally acknowledged this and agreed with current plan of care and followup with dermatology. Followup will pend labs to be obtained in September 2012. As per previous plan.

## 2011-05-09 ENCOUNTER — Other Ambulatory Visit: Payer: Managed Care, Other (non HMO)

## 2011-05-23 ENCOUNTER — Ambulatory Visit: Payer: Managed Care, Other (non HMO) | Admitting: Adult Health

## 2011-06-13 ENCOUNTER — Other Ambulatory Visit: Payer: Self-pay | Admitting: *Deleted

## 2011-06-13 DIAGNOSIS — B20 Human immunodeficiency virus [HIV] disease: Secondary | ICD-10-CM

## 2011-06-13 MED ORDER — AZITHROMYCIN 600 MG PO TABS: 1200.0000 mg | ORAL_TABLET | ORAL | Status: DC

## 2011-06-13 MED ORDER — RALTEGRAVIR POTASSIUM 400 MG PO TABS
400.0000 mg | ORAL_TABLET | Freq: Two times a day (BID) | ORAL | Status: DC
Start: 2011-06-13 — End: 2012-04-06

## 2011-06-13 MED ORDER — SULFAMETHOXAZOLE-TRIMETHOPRIM 800-160 MG PO TABS: 1.0000 | ORAL_TABLET | Freq: Every day | ORAL | Status: DC

## 2011-06-13 MED ORDER — EMTRICITABINE-TENOFOVIR DF 200-300 MG PO TABS: 1.0000 | ORAL_TABLET | Freq: Every day | ORAL | Status: DC

## 2011-10-10 ENCOUNTER — Other Ambulatory Visit: Payer: BC Managed Care – PPO

## 2011-10-10 ENCOUNTER — Telehealth: Payer: Self-pay

## 2011-10-10 DIAGNOSIS — Z79899 Other long term (current) drug therapy: Secondary | ICD-10-CM

## 2011-10-10 DIAGNOSIS — B20 Human immunodeficiency virus [HIV] disease: Secondary | ICD-10-CM

## 2011-10-10 NOTE — Telephone Encounter (Signed)
Patient came in and brought in copy of [employer] card - faxed to Nobie Putnam at [facility] today, 10/10/11.

## 2011-10-11 LAB — LIPID PANEL
LDL Cholesterol: 97 mg/dL (ref 0–99)
Total CHOL/HDL Ratio: 3.8 Ratio
VLDL: 18 mg/dL (ref 0–40)

## 2011-10-11 LAB — COMPREHENSIVE METABOLIC PANEL
ALT: 18 U/L (ref 0–53)
Albumin: 4.5 g/dL (ref 3.5–5.2)
Alkaline Phosphatase: 87 U/L (ref 39–117)
Glucose, Bld: 88 mg/dL (ref 70–99)
Potassium: 4.4 mEq/L (ref 3.5–5.3)
Sodium: 137 mEq/L (ref 135–145)
Total Protein: 7.7 g/dL (ref 6.0–8.3)

## 2011-10-11 LAB — CBC WITH DIFFERENTIAL/PLATELET
Basophils Relative: 1 % (ref 0–1)
Hemoglobin: 16.4 g/dL (ref 13.0–17.0)
Lymphs Abs: 1.3 10*3/uL (ref 0.7–4.0)
Monocytes Relative: 8 % (ref 3–12)
Neutro Abs: 1.3 10*3/uL — ABNORMAL LOW (ref 1.7–7.7)
Neutrophils Relative %: 44 % (ref 43–77)
Platelets: 203 10*3/uL (ref 150–400)
RBC: 5.58 MIL/uL (ref 4.22–5.81)

## 2011-10-11 LAB — T-HELPER CELL (CD4) - (RCID CLINIC ONLY)
CD4 % Helper T Cell: 13 % — ABNORMAL LOW (ref 33–55)
CD4 T Cell Abs: 180 uL — ABNORMAL LOW (ref 400–2700)

## 2011-10-14 LAB — HIV-1 RNA QUANT-NO REFLEX-BLD: HIV-1 RNA Quant, Log: 1.74 {Log} — ABNORMAL HIGH (ref <1.30)

## 2011-10-23 ENCOUNTER — Other Ambulatory Visit (HOSPITAL_COMMUNITY)
Admission: RE | Admit: 2011-10-23 | Discharge: 2011-10-23 | Disposition: A | Discharge status: Home / Self Care | Payer: BC Managed Care – PPO | Source: Ambulatory Visit | Attending: Adult Health | Admitting: Adult Health

## 2011-10-23 ENCOUNTER — Ambulatory Visit (INDEPENDENT_AMBULATORY_CARE_PROVIDER_SITE_OTHER): Payer: BC Managed Care – PPO | Admitting: Adult Health

## 2011-10-23 VITALS — BP 132/90 | HR 80 | Temp 97.8°F | Wt 191.0 lb

## 2011-10-23 DIAGNOSIS — Z0000 Encounter for general adult medical examination without abnormal findings: Secondary | ICD-10-CM

## 2011-10-23 DIAGNOSIS — A630 Anogenital (venereal) warts: Secondary | ICD-10-CM

## 2011-10-23 DIAGNOSIS — R896 Abnormal cytological findings in specimens from other organs, systems and tissues: Secondary | ICD-10-CM | POA: Insufficient documentation

## 2011-10-23 DIAGNOSIS — B20 Human immunodeficiency virus [HIV] disease: Secondary | ICD-10-CM

## 2011-10-23 DIAGNOSIS — R85618 Other abnormal cytological findings on specimens from anus: Secondary | ICD-10-CM | POA: Insufficient documentation

## 2011-10-23 NOTE — Patient Instructions (Signed)
Stop Azithromycin. We will contact you if the cytology exam results are abnormal.

## 2011-10-23 NOTE — Assessment & Plan Note (Signed)
None seen peri-anally.  Obtained specimen for non-gyne cytology exam.

## 2011-10-23 NOTE — Progress Notes (Signed)
Subjective:    Patient ID: Ronald [name] is a 36 y.o. male.  Chief Complaint: HIV Follow-up Visit Ronald [name] is here for follow-up of HIV infection. He is feeling better since his last visit.  He claims continued adherence to therapy with good tolerance and no complications. There are not additional complaints. He is asking that his rectal warts be reevaluated today.  Data Review: Diagnostic studies reviewed.  Review of Systems - General ROS: negative for - chills, fatigue, fever, malaise or night sweats Psychological ROS: negative for - anxiety, depression, irritability, memory difficulties or mood swings Ophthalmic ROS: negative ENT ROS: negative Respiratory ROS: no cough, shortness of breath, or wheezing Cardiovascular ROS: no chest pain or dyspnea on exertion Gastrointestinal ROS: no abdominal pain, change in bowel habits, or black or bloody stools Genito-Urinary ROS: no dysuria, trouble voiding, or hematuria Musculoskeletal ROS: negative Neurological ROS: no TIA or stroke symptoms Dermatological ROS: negative  Objective:   General appearance: alert, cooperative and no distress Head: Normocephalic, without obvious abnormality, atraumatic Eyes: conjunctivae/corneas clear. PERRL, EOM's intact. Fundi benign. Ears: normal TM's and external ear canals both ears Throat: lips, mucosa, and tongue normal; teeth and gums normal Resp: clear to auscultation bilaterally Cardio: regular rate and rhythm, S1, S2 normal, no murmur, click, rub or gallop GI: soft, non-tender; bowel sounds normal; no masses,  no organomegaly Male genitalia: penis: no lesions or discharge. testes: no masses or tenderness. no hernias, No active condylomatous lesions noted perianally. Extremities: extremities normal, atraumatic, no cyanosis or edema Skin: Skin color, texture, turgor normal. No rashes or lesions Neurologic: Alert and oriented X 3, normal strength and tone. Normal symmetric reflexes. Normal  coordination and gait Psych:  No vegetative signs or delusional behaviors noted.    Laboratory:  HIV 1 RNA Quant (copies/mL)  Date Value  10/10/2011 55*  02/07/2011 <20   11/09/2010 <20 copies/mL      CD4 T Cell Abs (cmm)  Date Value  10/10/2011 180*  02/07/2011 110*  11/09/2010 70*     CD4 % Helper T Cell (%)  Date Value  10/10/2011 13*  02/07/2011 9*  11/09/2010 6*     Hep A Total Ab (no units)  Date Value  07/19/2010 NEG      Hep B S Ab (no units)  Date Value  07/12/2010 POS*     Hepatitis B Surface Ag (no units)  Date Value  06/04/2010 NEG      HCV Ab (no units)  Date Value  06/04/2010 NEG           Assessment/Plan:   HIV INFECTION Clinically stable on current regimen. Continue present management. D/C Azithromycin. Counseling provided on prevention of transmission of HIV. Condoms offered:  Yes Medication adherence discussed with patient. Medication refills ordered as needed. Referrals: none Follow up visit in 4 months with labs 2 weeks prior to appointment. Patient verbally acknowledged information provided to them and agreed with plan of care.   Condyloma acuminata None seen peri-anally.  Obtained specimen for non-gyne cytology exam.      Ronald [name] A. Sundra Aland, MS, [facility] [phone]  10/23/2011, 3:18 PM

## 2011-10-23 NOTE — Assessment & Plan Note (Signed)
Clinically stable on current regimen. Continue present management. D/C Azithromycin. Counseling provided on prevention of transmission of HIV. Condoms offered:  Yes Medication adherence discussed with patient. Medication refills ordered as needed. Referrals: none Follow up visit in 4 months with labs 2 weeks prior to appointment. Patient verbally acknowledged information provided to them and agreed with plan of care.

## 2011-10-24 ENCOUNTER — Ambulatory Visit: Payer: Managed Care, Other (non HMO) | Admitting: Adult Health

## 2011-11-08 ENCOUNTER — Other Ambulatory Visit: Payer: Self-pay | Admitting: Infectious Diseases

## 2011-11-08 ENCOUNTER — Telehealth: Payer: Self-pay | Admitting: *Deleted

## 2011-11-08 DIAGNOSIS — D013 Carcinoma in situ of anus and anal canal: Secondary | ICD-10-CM

## 2011-11-08 NOTE — Telephone Encounter (Signed)
States he got a message. I told him what the other staff had written. He is ok with this . Ok with anytime wed or ams the rest of the week. I told him we will call him when we get an appt

## 2011-11-08 NOTE — Telephone Encounter (Signed)
Called patient and left message to return call.  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 Ninetta Lights wants him referred to a surgeon.  Once he returns the call and lets [place] know when he can go a referral will be made. Wendall Mola CMA

## 2011-11-13 ENCOUNTER — Telehealth: Payer: Self-pay | Admitting: *Deleted

## 2011-11-13 ENCOUNTER — Encounter: Payer: Self-pay | Admitting: *Deleted

## 2011-11-13 NOTE — Progress Notes (Signed)
Patient ID: Ronald [name], male   DOB: 1975/11/05, 36 y.o.   MRN: [id number]  Pt appointment scheduled with [facility] [phone]) on Friday, 4/5 @ 9:20 am with Dr. Rayburn Ma. Sent letter to patient about scheduled appointment. Will follow up for results. [name] RN

## 2011-11-27 ENCOUNTER — Other Ambulatory Visit: Payer: Self-pay | Admitting: Internal Medicine

## 2011-11-27 DIAGNOSIS — Z113 Encounter for screening for infections with a predominantly sexual mode of transmission: Secondary | ICD-10-CM

## 2011-11-27 DIAGNOSIS — B20 Human immunodeficiency virus [HIV] disease: Secondary | ICD-10-CM

## 2011-12-02 DIAGNOSIS — A630 Anogenital (venereal) warts: Secondary | ICD-10-CM

## 2011-12-02 HISTORY — DX: Anogenital (venereal) warts: A63.0

## 2011-12-06 ENCOUNTER — Encounter (INDEPENDENT_AMBULATORY_CARE_PROVIDER_SITE_OTHER): Payer: Self-pay | Admitting: Surgery

## 2011-12-06 ENCOUNTER — Ambulatory Visit (INDEPENDENT_AMBULATORY_CARE_PROVIDER_SITE_OTHER): Payer: BC Managed Care – PPO | Admitting: Surgery

## 2011-12-06 VITALS — BP 136/82 | HR 76 | Temp 97.8°F | Resp 18 | Ht 66.0 in | Wt 192.0 lb

## 2011-12-06 DIAGNOSIS — A630 Anogenital (venereal) warts: Secondary | ICD-10-CM

## 2011-12-06 NOTE — Progress Notes (Signed)
Patient ID: Ronald [name], male   DOB: 1976-06-29, 36 y.o.   MRN: [id number]  Chief Complaint  Patient presents with  . Rectal Problems    Severe dysplasia of anal canal    HPI Ronald [name] is a 36 y.o. male.  This is a very pleasant gentleman referred by Dr. Ninetta Lights. He has HIV. 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 He had an anal swab performed. The cytology demonstrated some atypical cells. He has been sent here for a surgical opinion. He has had no previous problems with condyloma. She has no bleeding. He has no issues with incontinence. Bowel movements are normal. HPI  Past Medical History  Diagnosis Date  . HIV infection   . Hypertension     Was for a brief time only. Not taking any medication, it is under control.    History reviewed. No pertinent past surgical history.  Family History  Problem Relation Age of Onset  . Hypertension Mother   . Cancer Father   . Diabetes Maternal Aunt   . Seizures Paternal Aunt     Social History History  Substance Use Topics  . Smoking status: Current Everyday Smoker -- 0.5 packs/day    Types: Cigarettes  . Smokeless tobacco: Never Used  . Alcohol Use: Yes     1 per week    Allergies  Allergen Reactions  . Ibuprofen Hives    Will appear anywhere on the body. Not the same place each time.    Current Outpatient Prescriptions  Medication Sig Dispense Refill  . emtricitabine-tenofovir (TRUVADA) 200-300 MG per tablet Take 1 tablet by mouth daily.  30 tablet  5  . ketoconazole (NIZORAL) 2 % cream Apply 1 application topically 2 (two) times daily.  15 g  3  . raltegravir (ISENTRESS) 400 MG tablet Take 1 tablet (400 mg total) by mouth 2 (two) times daily.  60 tablet  5  . sulfamethoxazole-trimethoprim (BACTRIM DS,SEPTRA DS) 800-160 MG per tablet Take 1 tablet by mouth daily.  30 tablet  5    Review of Systems Review of Systems  Constitutional: Negative for fever, chills and unexpected  weight change.  HENT: Negative for hearing loss, congestion, sore throat, trouble swallowing and voice change.   Eyes: Negative for visual disturbance.  Respiratory: Negative for cough and wheezing.   Cardiovascular: Negative for chest pain, palpitations and leg swelling.  Gastrointestinal: Negative for nausea, vomiting, abdominal pain, diarrhea, constipation, blood in stool, abdominal distention, anal bleeding and rectal pain.  Genitourinary: Negative for hematuria and difficulty urinating.  Musculoskeletal: Negative for arthralgias.  Skin: Negative for rash and wound.  Neurological: Negative for seizures, syncope, weakness and headaches.  Hematological: Negative for adenopathy. Does not bruise/bleed easily.  Psychiatric/Behavioral: Negative for confusion.    Blood pressure 136/82, pulse 76, temperature 97.8 F (36.6 C), temperature source Temporal, resp. rate 18, height 5\' 6"  (1.676 m), weight 192 lb (87.091 kg).  Physical Exam Physical Exam  Constitutional: He is oriented to person, place, and time. He appears well-developed and well-nourished. No distress.  HENT:  Head: Normocephalic and atraumatic.  Right Ear: External ear normal.  Left Ear: External ear normal.  Nose: Nose normal.  Mouth/Throat: Oropharynx is clear and moist. No oropharyngeal exudate.  Eyes: Conjunctivae are normal. Pupils are equal, round, and reactive to light.  Neck: Normal range of motion. Neck supple. No tracheal deviation present. No thyromegaly present.  Cardiovascular: Normal rate, regular rhythm, normal heart sounds and intact  distal pulses.   No murmur heard. Pulmonary/Chest: Effort normal and breath sounds normal. No respiratory distress. He has no wheezes.  Abdominal: Soft. Bowel sounds are normal. He exhibits no distension. There is no tenderness.  Genitourinary: Rectum normal.       I performed a digital exam and anoscopy. He had one lateral condyloma which was quite small. I could not to  visualize or palpate any internal anal masses or condyloma  Musculoskeletal: Normal range of motion. He exhibits no edema and no tenderness.  Lymphadenopathy:    He has no cervical adenopathy.  Neurological: He is alert and oriented to person, place, and time.  Skin: Skin is warm and dry. No rash noted. No erythema.  Psychiatric: His behavior is normal. Judgment normal.    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 I have discussed this with him in detail. I have discussed the progression of anal condylomata squamous cell cancer. I discussed the risk of surgery which includes but is not limited to bleeding, infection, recurrence, need for further surgery, incontinence, et Ronald [name]. He understands and wishes to proceed. Likelihood of success is good       Lelani Garnett A 12/06/2011, 9:07 AM

## 2011-12-10 ENCOUNTER — Encounter (HOSPITAL_COMMUNITY): Payer: Self-pay | Admitting: Pharmacy Technician

## 2011-12-11 ENCOUNTER — Other Ambulatory Visit: Payer: Self-pay | Admitting: *Deleted

## 2011-12-11 DIAGNOSIS — Z113 Encounter for screening for infections with a predominantly sexual mode of transmission: Secondary | ICD-10-CM

## 2011-12-17 ENCOUNTER — Other Ambulatory Visit (HOSPITAL_COMMUNITY): Payer: BC Managed Care – PPO

## 2011-12-18 ENCOUNTER — Ambulatory Visit (HOSPITAL_COMMUNITY): Admission: RE | Admit: 2011-12-18 | Payer: BC Managed Care – PPO | Source: Ambulatory Visit | Admitting: Surgery

## 2011-12-18 ENCOUNTER — Encounter (HOSPITAL_COMMUNITY): Admission: RE | Payer: Self-pay | Source: Ambulatory Visit

## 2011-12-18 SURGERY — EXCISION, NEOPLASM, INTRAEPITHELIAL, ANUS
Anesthesia: General

## 2011-12-20 ENCOUNTER — Encounter (HOSPITAL_BASED_OUTPATIENT_CLINIC_OR_DEPARTMENT_OTHER): Payer: Self-pay | Admitting: *Deleted

## 2011-12-26 NOTE — H&P (Signed)
Ronald Rubenstein, MD 12/06/2011 9:14 AM Signed  Patient ID: Ronald [name], male DOB: 05-13-1976, 36 y.o. MRN: [id number]  Chief Complaint   Patient presents with   .  Rectal Problems     Severe dysplasia of anal canal    HPI  Ronald [name] is a 36 y.o. male. This is a very pleasant gentleman referred by Dr. Ninetta Lights. He has HIV. 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 He had an anal swab performed. The cytology demonstrated some atypical cells. He has been sent here for a surgical opinion. He has had no previous problems with condyloma. She has no bleeding. He has no issues with incontinence. Bowel movements are normal.  HPI  Past Medical History   Diagnosis  Date   .  HIV infection    .  Hypertension      Was for a brief time only. Not taking any medication, it is under control.    History reviewed. No pertinent past surgical history.  Family History   Problem  Relation  Age of Onset   .  Hypertension  Mother    .  Cancer  Father    .  Diabetes  Maternal Aunt    .  Seizures  Paternal Aunt     Social History  History   Substance Use Topics   .  Smoking status:  Current Everyday Smoker -- 0.5 packs/day     Types:  Cigarettes   .  Smokeless tobacco:  Never Used   .  Alcohol Use:  Yes      1 per week    Allergies   Allergen  Reactions   .  Ibuprofen  Hives     Will appear anywhere on the body. Not the same place each time.    Current Outpatient Prescriptions   Medication  Sig  Dispense  Refill   .  emtricitabine-tenofovir (TRUVADA) 200-300 MG per tablet  Take 1 tablet by mouth daily.  30 tablet  5   .  ketoconazole (NIZORAL) 2 % cream  Apply 1 application topically 2 (two) times daily.  15 g  3   .  raltegravir (ISENTRESS) 400 MG tablet  Take 1 tablet (400 mg total) by mouth 2 (two) times daily.  60 tablet  5   .  sulfamethoxazole-trimethoprim (BACTRIM DS,SEPTRA DS) 800-160 MG per tablet  Take 1 tablet by mouth daily.  30 tablet  5     Review of Systems  Review of Systems  Constitutional: Negative for fever, chills and unexpected weight change.  HENT: Negative for hearing loss, congestion, sore throat, trouble swallowing and voice change.  Eyes: Negative for visual disturbance.  Respiratory: Negative for cough and wheezing.  Cardiovascular: Negative for chest pain, palpitations and leg swelling.  Gastrointestinal: Negative for nausea, vomiting, abdominal pain, diarrhea, constipation, blood in stool, abdominal distention, anal bleeding and rectal pain.  Genitourinary: Negative for hematuria and difficulty urinating.  Musculoskeletal: Negative for arthralgias.  Skin: Negative for rash and wound.  Neurological: Negative for seizures, syncope, weakness and headaches.  Hematological: Negative for adenopathy. Does not bruise/bleed easily.  Psychiatric/Behavioral: Negative for confusion.   Blood pressure 136/82, pulse 76, temperature 97.8 F (36.6 C), temperature source Temporal, resp. rate 18, height 5\' 6"  (1.676 m), weight 192 lb (87.091 kg).  Physical Exam  Physical Exam  Constitutional: He is oriented to person, place, and time. He appears well-developed and well-nourished. No distress.  HENT:  Head: Normocephalic and  atraumatic.  Right Ear: External ear normal.  Left Ear: External ear normal.  Nose: Nose normal.  Mouth/Throat: Oropharynx is clear and moist. No oropharyngeal exudate.  Eyes: Conjunctivae are normal. Pupils are equal, round, and reactive to light.  Neck: Normal range of motion. Neck supple. No tracheal deviation present. No thyromegaly present.  Cardiovascular: Normal rate, regular rhythm, normal heart sounds and intact distal pulses.  No murmur heard.  Pulmonary/Chest: Effort normal and breath sounds normal. No respiratory distress. He has no wheezes.  Abdominal: Soft. Bowel sounds are normal. He exhibits no distension. There is no tenderness.  Genitourinary: Rectum normal.  I performed a digital  exam and anoscopy. He had one lateral condyloma which was quite small. I could not to visualize or palpate any internal anal masses or condyloma  Musculoskeletal: Normal range of motion. He exhibits no edema and no tenderness.  Lymphadenopathy:  He has no cervical adenopathy.  Neurological: He is alert and oriented to person, place, and time.  Skin: Skin is warm and dry. No rash noted. No erythema.  Psychiatric: His behavior is normal. Judgment normal.   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 I have discussed this with him in detail. I have discussed the progression of anal condylomata squamous cell cancer. I discussed the risk of surgery which includes but is not limited to bleeding, infection, recurrence, need for further surgery, incontinence, et Karie Soda. He understands and wishes to proceed. Likelihood of success is good   Yessenia Maillet A

## 2011-12-27 ENCOUNTER — Encounter (HOSPITAL_BASED_OUTPATIENT_CLINIC_OR_DEPARTMENT_OTHER): Payer: Self-pay | Admitting: Anesthesiology

## 2011-12-27 ENCOUNTER — Encounter (HOSPITAL_BASED_OUTPATIENT_CLINIC_OR_DEPARTMENT_OTHER)
Admission: RE | Disposition: A | Discharge status: Home / Self Care | Payer: Self-pay | Source: Ambulatory Visit | Attending: Surgery

## 2011-12-27 ENCOUNTER — Ambulatory Visit (HOSPITAL_BASED_OUTPATIENT_CLINIC_OR_DEPARTMENT_OTHER): Payer: BC Managed Care – PPO | Admitting: Anesthesiology

## 2011-12-27 ENCOUNTER — Ambulatory Visit (HOSPITAL_BASED_OUTPATIENT_CLINIC_OR_DEPARTMENT_OTHER)
Admission: RE | Admit: 2011-12-27 | Discharge: 2011-12-27 | Disposition: A | Discharge status: Home / Self Care | Payer: BC Managed Care – PPO | Source: Ambulatory Visit | Attending: Surgery | Admitting: Surgery

## 2011-12-27 ENCOUNTER — Encounter (HOSPITAL_BASED_OUTPATIENT_CLINIC_OR_DEPARTMENT_OTHER): Payer: Self-pay | Admitting: *Deleted

## 2011-12-27 DIAGNOSIS — A630 Anogenital (venereal) warts: Secondary | ICD-10-CM

## 2011-12-27 DIAGNOSIS — Z21 Asymptomatic human immunodeficiency virus [HIV] infection status: Secondary | ICD-10-CM | POA: Insufficient documentation

## 2011-12-27 DIAGNOSIS — I10 Essential (primary) hypertension: Secondary | ICD-10-CM | POA: Insufficient documentation

## 2011-12-27 HISTORY — PX: EXAMINATION UNDER ANESTHESIA: SHX1540

## 2011-12-27 HISTORY — DX: Anogenital (venereal) warts: A63.0

## 2011-12-27 HISTORY — PX: WART FULGURATION: SHX5245

## 2011-12-27 SURGERY — EXAM UNDER ANESTHESIA
Anesthesia: General | Site: Rectum

## 2011-12-27 MED ORDER — MORPHINE SULFATE 2 MG/ML IJ SOLN: 2.0000 mg | INTRAMUSCULAR | Status: DC | PRN

## 2011-12-27 MED ORDER — FENTANYL CITRATE 0.05 MG/ML IJ SOLN
INTRAMUSCULAR | Status: DC | PRN
  Administered 2011-12-27: 100 ug via INTRAVENOUS

## 2011-12-27 MED ORDER — LORAZEPAM 2 MG/ML IJ SOLN: 1.0000 mg | Freq: Once | INTRAMUSCULAR | Status: DC | PRN

## 2011-12-27 MED ORDER — HYDROMORPHONE HCL PF 1 MG/ML IJ SOLN: 0.2500 mg | INTRAMUSCULAR | Status: DC | PRN

## 2011-12-27 MED ORDER — ACETAMINOPHEN 325 MG PO TABS: 650.0000 mg | ORAL_TABLET | ORAL | Status: DC | PRN

## 2011-12-27 MED ORDER — CEFAZOLIN SODIUM 1-5 GM-% IV SOLN: 1.0000 g | INTRAVENOUS | Status: DC

## 2011-12-27 MED ORDER — MIDAZOLAM HCL 2 MG/2ML IJ SOLN: 1.0000 mg | INTRAMUSCULAR | Status: DC | PRN

## 2011-12-27 MED ORDER — BUPIVACAINE-EPINEPHRINE 0.5% -1:200000 IJ SOLN
INTRAMUSCULAR | Status: DC | PRN
  Administered 2011-12-27: 10 mL

## 2011-12-27 MED ORDER — MIDAZOLAM HCL 5 MG/5ML IJ SOLN
INTRAMUSCULAR | Status: DC | PRN
  Administered 2011-12-27: 2 mg via INTRAVENOUS

## 2011-12-27 MED ORDER — ONDANSETRON HCL 4 MG/2ML IJ SOLN
INTRAMUSCULAR | Status: DC | PRN
  Administered 2011-12-27: 4 mg via INTRAVENOUS

## 2011-12-27 MED ORDER — PROPOFOL 10 MG/ML IV EMUL
INTRAVENOUS | Status: DC | PRN
  Administered 2011-12-27: 200 mg via INTRAVENOUS
  Administered 2011-12-27: 100 mg via INTRAVENOUS

## 2011-12-27 MED ORDER — OXYCODONE HCL 5 MG PO TABS: 5.0000 mg | ORAL_TABLET | ORAL | Status: DC | PRN

## 2011-12-27 MED ORDER — ONDANSETRON HCL 4 MG/2ML IJ SOLN: 4.0000 mg | Freq: Four times a day (QID) | INTRAMUSCULAR | Status: DC | PRN

## 2011-12-27 MED ORDER — CEFAZOLIN SODIUM-DEXTROSE 2-3 GM-% IV SOLR: 2.0000 g | INTRAVENOUS | Status: DC

## 2011-12-27 MED ORDER — FENTANYL CITRATE 0.05 MG/ML IJ SOLN: 50.0000 ug | INTRAMUSCULAR | Status: DC | PRN

## 2011-12-27 MED ORDER — OXYCODONE-ACETAMINOPHEN 5-325 MG PO TABS: 1.0000 | ORAL_TABLET | ORAL | Status: AC | PRN

## 2011-12-27 MED ORDER — SODIUM CHLORIDE 0.9 % IV SOLN: 250.0000 mL | INTRAVENOUS | Status: DC | PRN

## 2011-12-27 MED ORDER — LIDOCAINE 5 % EX OINT: TOPICAL_OINTMENT | CUTANEOUS | Status: DC | PRN

## 2011-12-27 MED ORDER — LIDOCAINE HCL (CARDIAC) 20 MG/ML IV SOLN
INTRAVENOUS | Status: DC | PRN
  Administered 2011-12-27: 50 mg via INTRAVENOUS

## 2011-12-27 MED ORDER — SODIUM CHLORIDE 0.9 % IJ SOLN: 3.0000 mL | INTRAMUSCULAR | Status: DC | PRN

## 2011-12-27 MED ORDER — LACTATED RINGERS IV SOLN
INTRAVENOUS | Status: DC | PRN
  Administered 2011-12-27: 16:00:00 via INTRAVENOUS

## 2011-12-27 MED ORDER — ACETAMINOPHEN 650 MG RE SUPP: 650.0000 mg | RECTAL | Status: DC | PRN

## 2011-12-27 MED ORDER — LACTATED RINGERS IV SOLN
INTRAVENOUS | Status: DC
  Administered 2011-12-27: 14:00:00 via INTRAVENOUS
  Administered 2011-12-27: 20 mL/h via INTRAVENOUS

## 2011-12-27 MED ORDER — SODIUM CHLORIDE 0.9 % IJ SOLN: 3.0000 mL | Freq: Two times a day (BID) | INTRAMUSCULAR | Status: DC

## 2011-12-27 SURGICAL SUPPLY — 48 items
BLADE SURG 15 STRL LF DISP TIS (BLADE) ×2 IMPLANT
BLADE SURG 15 STRL SS (BLADE) ×1
CANISTER SUCTION 1200CC (MISCELLANEOUS) ×3 IMPLANT
CLEANER CAUTERY TIP 5X5 PAD (MISCELLANEOUS) ×2 IMPLANT
CLOTH BEACON ORANGE TIMEOUT ST (SAFETY) ×3 IMPLANT
COVER MAYO STAND STRL (DRAPES) IMPLANT
COVER TABLE BACK 60X90 (DRAPES) ×3 IMPLANT
DECANTER SPIKE VIAL GLASS SM (MISCELLANEOUS) ×3 IMPLANT
DRAPE UTILITY XL STRL (DRAPES) ×3 IMPLANT
DRSG PAD ABDOMINAL 8X10 ST (GAUZE/BANDAGES/DRESSINGS) IMPLANT
ELECT NEEDLE TIP 2.8 STRL (NEEDLE) ×3 IMPLANT
ELECT REM PT RETURN 9FT ADLT (ELECTROSURGICAL) ×3
ELECTRODE REM PT RTRN 9FT ADLT (ELECTROSURGICAL) ×2 IMPLANT
FILTER 7/8 IN (FILTER) ×3 IMPLANT
GAUZE SPONGE 4X4 12PLY STRL LF (GAUZE/BANDAGES/DRESSINGS) ×6 IMPLANT
GLOVE BIO SURGEON STRL SZ8 (GLOVE) IMPLANT
GLOVE BIOGEL PI IND STRL 7.0 (GLOVE) ×2 IMPLANT
GLOVE BIOGEL PI IND STRL 7.5 (GLOVE) IMPLANT
GLOVE BIOGEL PI INDICATOR 7.0 (GLOVE) ×1
GLOVE BIOGEL PI INDICATOR 7.5 (GLOVE)
GLOVE ECLIPSE 6.5 STRL STRAW (GLOVE) ×3 IMPLANT
GLOVE SURG SIGNA 7.5 PF LTX (GLOVE) ×3 IMPLANT
GOWN BRE IMP PREV XXLGXLNG (GOWN DISPOSABLE) ×3 IMPLANT
GOWN PREVENTION PLUS XLARGE (GOWN DISPOSABLE) ×3 IMPLANT
GOWN PREVENTION PLUS XXLARGE (GOWN DISPOSABLE) IMPLANT
NEEDLE HYPO 22GX1.5 SAFETY (NEEDLE) IMPLANT
NEEDLE HYPO 25X1 1.5 SAFETY (NEEDLE) ×3 IMPLANT
PACK BASIN DAY SURGERY FS (CUSTOM PROCEDURE TRAY) ×3 IMPLANT
PACK LITHOTOMY IV (CUSTOM PROCEDURE TRAY) ×3 IMPLANT
PAD CLEANER CAUTERY TIP 5X5 (MISCELLANEOUS) ×1
PENCIL BUTTON HOLSTER BLD 10FT (ELECTRODE) ×3 IMPLANT
SHEET MEDIUM DRAPE 40X70 STRL (DRAPES) IMPLANT
SLEEVE SCD COMPRESS KNEE MED (MISCELLANEOUS) ×3 IMPLANT
SPONGE SURGIFOAM ABS GEL 100 (HEMOSTASIS) IMPLANT
SPONGE SURGIFOAM ABS GEL 12-7 (HEMOSTASIS) IMPLANT
SURGILUBE 2OZ TUBE FLIPTOP (MISCELLANEOUS) ×3 IMPLANT
SUT CHROMIC 3 0 SH 27 (SUTURE) IMPLANT
SYR CONTROL 10ML LL (SYRINGE) ×3 IMPLANT
TOWEL OR 17X24 6PK STRL BLUE (TOWEL DISPOSABLE) ×6 IMPLANT
TOWEL OR NON WOVEN STRL DISP B (DISPOSABLE) ×3 IMPLANT
TRAY DSU PREP LF (CUSTOM PROCEDURE TRAY) ×3 IMPLANT
TRAY PROCTOSCOPIC FIBER OPTIC (SET/KITS/TRAYS/PACK) IMPLANT
TUBE CONNECTING 20X1/4 (TUBING) ×3 IMPLANT
TUBING STERILE (MISCELLANEOUS) ×3 IMPLANT
UNDERPAD 30X30 INCONTINENT (UNDERPADS AND DIAPERS) ×3 IMPLANT
VAC PENCILS W/TUBING CLEAR (MISCELLANEOUS) ×3 IMPLANT
WATER STERILE IRR 1000ML POUR (IV SOLUTION) ×3 IMPLANT
YANKAUER SUCT BULB TIP NO VENT (SUCTIONS) ×3 IMPLANT

## 2011-12-27 NOTE — Anesthesia Postprocedure Evaluation (Signed)
  Anesthesia Post-op Note  Patient: Ronald [name]  Procedure(s) Performed: Procedure(s) (LRB): EXAM UNDER ANESTHESIA (N/A) FULGURATION ANAL WART (N/A)  Patient Location: PACU  Anesthesia Type: General  Level of Consciousness: awake  Airway and Oxygen Therapy: Patient Spontanous Breathing  Post-op Pain: mild  Post-op Assessment: Post-op Vital signs reviewed, Patient's Cardiovascular Status Stable, Respiratory Function Stable, Patent Airway, No signs of Nausea or vomiting and Adequate PO intake  Post-op Vital Signs: stable  Complications: No apparent anesthesia complications

## 2011-12-27 NOTE — Op Note (Signed)
EXAM UNDER ANESTHESIA, FULGURATION ANAL WART  Procedure Note  Ronald [name] 12/27/2011   Pre-op Diagnosis: anal condyloma     Post-op Diagnosis: same  Procedure(s): EXAM UNDER ANESTHESIA FULGURATION ANAL WART  Surgeon(s): Shelly Rubenstein, MD  Anesthesia: General  Staff:  Herminio Commons, RN - Circulator Randalyn Rhea, RN - Scrub Person  Estimated Blood Loss: Minimal               Indications: This is [name] 36 year old gentleman with [name] history of anal condyloma and HIV who had [name] recent anal swab showing atypical cells. The decision was made to proceed with exam under anesthesia.  Findings: The patient had [name] single small condyloma which was excised. There was no evidence of condyloma or mass in the anal canal.  Procedure: The patient was brought to the operating room and identified as the correct patient. He was placed supine on the operating room table and general anesthesia was induced. He was then placed in the lithotomy position. His perianal area was then prepped and draped in the usual sterile fashion. The patient had one condyloma on the external skin site excised with the cautery and sent to pathology. No other external condyloma were identified. I then inserted [name] retractor into the anal canal and took circumferential inspection. I was able to visualize the anal canal in its entirety circumferentially and identified no evidence of condyloma or intra-anal mass. After further inspection the retractor was then removed. I anesthetized the one area of resection with Marcaine with epinephrine.  The patient tolerated the procedure well. All counts were correct at the end of the procedure. The patient was then extubated in the operating room and taken in [name] stable condition to the recovery room.          Ronald [name]   Date: 12/27/2011  Time: 4:19 PM

## 2011-12-27 NOTE — Discharge Instructions (Signed)
[facility] _______[facility] ° °RECTAL SURGERY POST OP INSTRUCTIONS: POST OP INSTRUCTIONS ° °Always review your discharge instruction sheet given to you by the facility where your surgery was performed. °IF YOU HAVE DISABILITY OR FAMILY LEAVE FORMS, YOU MUST BRING THEM TO THE OFFICE FOR PROCESSING.   °DO NOT GIVE THEM TO YOUR DOCTOR. ° °1. A  prescription for pain medication may be given to you upon discharge.  Take your pain medication as prescribed, if needed.  If narcotic pain medicine is not needed, then you may take acetaminophen (Tylenol) or ibuprofen (Advil) as needed. °2. Take your usually prescribed medications unless otherwise directed. °3. If you need a refill on your pain medication, please contact your pharmacy.  They will contact our office to request authorization. Prescriptions will not be filled after 5 pm or on week-ends. °4. You should follow a light diet the first 48 hours after arrival home, such as soup and crackers, etc.  Be sure to include lots of fluids daily.  Resume your normal diet 2-3 days after surgery.. °5. Most patients will experience some swelling and discomfort in the rectal area. Ice packs, reclining and warm tub soaks will help.  Swelling and discomfort can take several days to resolve.  °6. It is common to experience some constipation if taking pain medication after surgery.  Increasing fluid intake and taking a stool softener (such as Colace) will usually help or prevent this problem from occurring.  A mild laxative (Milk of Magnesia or Miralax) should be taken according to package directions if there are no bowel movements after 48 hours. °7. Unless discharge instructions indicate otherwise, leave your bandage dry and in place for 24 hours, or remove the bandage if you have a bowel movement. You may notice a small amount of bleeding with bowel movements for the first few days. You may have some packing in the rectum which will come out over the first day or two. You  will need to wear an absorbent pad or soft cotton gauze in your underwear until the drainage stops.it. °8. ACTIVITIES:  You may resume regular (light) daily activities beginning the next day--such as daily self-care, walking, climbing stairs--gradually increasing activities as tolerated.  You may have sexual intercourse when it is comfortable.  Refrain from any heavy lifting or straining until approved by your doctor. °a. You may drive when you are no longer taking prescription pain medication, you can comfortably wear a seatbelt, and you can safely maneuver your car and apply brakes. °b. RETURN TO WORK: : ____________________ °c.  °9. You should see your doctor in the office for a follow-up appointment approximately 2-3 weeks after your surgery.  Make sure that you call for this appointment within a day or two after you arrive home to insure a convenient appointment time. °10. OTHER INSTRUCTIONS:  __________________________________________________________________________________________________________________________________________________________________________________________  °WHEN TO CALL YOUR DOCTOR: °1. Fever over 101.0 °2. Inability to urinate °3. Nausea and/or vomiting °4. Extreme swelling or bruising °5. Continued bleeding from rectum. °6. Increased pain, redness, or drainage from the incision °7. Constipation ° °The clinic staff is available to answer your questions during regular business hours.  Please don’t hesitate to call and ask to speak to one of the nurses for clinical concerns.  If you have a medical emergency, go to the nearest emergency room or call 911.  A surgeon from [facility] is always on call at the hospital ° ° °[address], [address], [place], [place]  [address] ? °   [address] [address], [place], [place]   [address] °[phone] ? [phone] ? FAX [phone] °Web site: [email or web address] ° ° °Post Anesthesia Home Care Instructions ° °Activity: °Get  plenty of rest for the remainder of the day. A responsible adult should stay with you for 24 hours following the procedure.  °For the next 24 hours, DO NOT: °-Drive a car °-Operate machinery °-Drink alcoholic beverages °-Take any medication unless instructed by your physician °-Make any legal decisions or sign important papers. ° °Meals: °Start with liquid foods such as gelatin or soup. Progress to regular foods as tolerated. Avoid greasy, spicy, heavy foods. If nausea and/or vomiting occur, drink only clear liquids until the nausea and/or vomiting subsides. Call your physician if vomiting continues. ° °Special Instructions/Symptoms: °Your throat may feel dry or sore from the anesthesia or the breathing tube placed in your throat during surgery. If this causes discomfort, gargle with warm salt water. The discomfort should disappear within 24 hours. ° °

## 2011-12-27 NOTE — Interval H&P Note (Signed)
History and Physical Interval Note:  He has had no change in his history or exam  12/27/2011 12:52 PM  Ronald [name]  has presented today for surgery, with the diagnosis of anal condyloma  The various methods of treatment have been discussed with the patient and family. After consideration of risks, benefits and other options for treatment, the patient has consented to  Procedure(s) (LRB): EXAM UNDER ANESTHESIA (N/A) FULGURATION ANAL WART (N/A) as a surgical intervention .  The patients' history has been reviewed, patient examined, no change in status, stable for surgery.  I have reviewed the patients' chart and labs.  Questions were answered to the patient's satisfaction.     Aleda Madl A

## 2011-12-27 NOTE — Anesthesia Preprocedure Evaluation (Signed)
Anesthesia Evaluation  Patient identified by MRN, date of birth, ID band Patient awake    Reviewed: Allergy & Precautions, H&P , NPO status , Patient's Chart, lab work & pertinent test results  Airway Mallampati: I TM Distance: >3 FB Neck ROM: Full    Dental   Pulmonary  Lung nodule   Pulmonary exam normal       Cardiovascular hypertension,     Neuro/Psych  Neuromuscular disease    GI/Hepatic   Endo/Other    Renal/GU      Musculoskeletal   Abdominal   Peds  Hematology  (+) HIV,   Anesthesia Other Findings   Reproductive/Obstetrics                           Anesthesia Physical Anesthesia Plan  ASA: III  Anesthesia Plan: General   Post-op Pain Management:    Induction: Intravenous  Airway Management Planned: LMA  Additional Equipment:   Intra-op Plan:   Post-operative Plan: Extubation in OR  Informed Consent: I have reviewed the patients History and Physical, chart, labs and discussed the procedure including the risks, benefits and alternatives for the proposed anesthesia with the patient or authorized representative who has indicated his/her understanding and acceptance.     Plan Discussed with: CRNA and Surgeon  Anesthesia Plan Comments:         Anesthesia Quick Evaluation

## 2011-12-27 NOTE — Anesthesia Procedure Notes (Signed)
Procedure Name: LMA Insertion Date/Time: 12/27/2011 4:03 PM Performed by: Caren Macadam Pre-anesthesia Checklist: Patient identified, Emergency Drugs available, Suction available and Patient being monitored Patient Re-evaluated:Patient Re-evaluated prior to inductionOxygen Delivery Method: Circle System Utilized Preoxygenation: Pre-oxygenation with 100% oxygen Intubation Type: IV induction Ventilation: Mask ventilation without difficulty LMA: LMA inserted LMA Size: 4.0 Number of attempts: 1 Airway Equipment and Method: bite block Placement Confirmation: positive ETCO2 Tube secured with: Tape Dental Injury: Teeth and Oropharynx as per pre-operative assessment

## 2011-12-27 NOTE — Transfer of Care (Signed)
Immediate Anesthesia Transfer of Care Note  Patient: Ronald [name]  Procedure(s) Performed: Procedure(s) (LRB): EXAM UNDER ANESTHESIA (N/A) FULGURATION ANAL WART (N/A)  Patient Location: PACU  Anesthesia Type: General  Level of Consciousness: awake, alert  and oriented  Airway & Oxygen Therapy: Patient Spontanous Breathing and Patient connected to face mask oxygen  Post-op Assessment: Report given to PACU RN and Post -op Vital signs reviewed and stable  Post vital signs: Reviewed and stable  Complications: No apparent anesthesia complications

## 2011-12-30 ENCOUNTER — Telehealth (INDEPENDENT_AMBULATORY_CARE_PROVIDER_SITE_OTHER): Payer: Self-pay | Admitting: General Surgery

## 2011-12-30 NOTE — Telephone Encounter (Signed)
Pt calling for post op appt on Friday and RTW letter.  He had surgery this past Friday.  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  First available appt is several weeks out.  Please FAX RTW letter to attn: Jorje Guild at [phone].

## 2011-12-31 ENCOUNTER — Encounter (HOSPITAL_BASED_OUTPATIENT_CLINIC_OR_DEPARTMENT_OTHER): Payer: Self-pay | Admitting: Surgery

## 2012-01-01 ENCOUNTER — Telehealth (INDEPENDENT_AMBULATORY_CARE_PROVIDER_SITE_OTHER): Payer: Self-pay | Admitting: General Surgery

## 2012-01-01 NOTE — Telephone Encounter (Signed)
Called pt for the pasted two day to call me back for a note that he called on the 12-30-11 for going back to work , Dr Magnus Ivan review the phone message and ok the note , the pt has not called me back, he also needs a appt with Dr Magnus Ivan. I if the pt calls he needs to talk to me and needs a appt

## 2012-01-03 ENCOUNTER — Encounter (INDEPENDENT_AMBULATORY_CARE_PROVIDER_SITE_OTHER): Payer: Self-pay | Admitting: Surgery

## 2012-01-03 ENCOUNTER — Ambulatory Visit (INDEPENDENT_AMBULATORY_CARE_PROVIDER_SITE_OTHER): Payer: BC Managed Care – PPO | Admitting: Surgery

## 2012-01-03 VITALS — BP 130/96 | HR 68 | Temp 97.4°F | Resp 16 | Ht 66.0 in | Wt 190.6 lb

## 2012-01-03 DIAGNOSIS — Z09 Encounter for follow-up examination after completed treatment for conditions other than malignant neoplasm: Secondary | ICD-10-CM

## 2012-01-03 NOTE — Progress Notes (Signed)
Subjective:     Patient ID: Ronald [name], male   DOB: December 28, 1975, 36 y.o.   MRN: [id number]  HPI  He is here for his first postoperative visit status post excision of anal condyloma. He reports only mild discomfort Review of Systems     Objective:   Physical Exam On exam, his incision is healing well. There is no evidence of infection. Pathology confirmed condyloma with no evidence of malignancy or atypia    Assessment:     Patient status post excision of anal condyloma with no evidence of atypia. Again at the time of surgery there were no intra-anal condyloma seen    Plan:     I will see him back as needed

## 2012-02-06 ENCOUNTER — Other Ambulatory Visit: Payer: BC Managed Care – PPO

## 2012-02-12 ENCOUNTER — Other Ambulatory Visit: Payer: BC Managed Care – PPO

## 2012-02-20 ENCOUNTER — Ambulatory Visit: Payer: BC Managed Care – PPO | Admitting: Adult Health

## 2012-02-20 ENCOUNTER — Telehealth: Payer: Self-pay | Admitting: *Deleted

## 2012-02-20 ENCOUNTER — Ambulatory Visit: Payer: BC Managed Care – PPO | Admitting: Internal Medicine

## 2012-02-20 NOTE — Telephone Encounter (Signed)
I left a message asking him to call & reschedule his missed appt

## 2012-04-06 ENCOUNTER — Other Ambulatory Visit: Payer: Self-pay | Admitting: *Deleted

## 2012-04-06 DIAGNOSIS — B20 Human immunodeficiency virus [HIV] disease: Secondary | ICD-10-CM

## 2012-04-06 MED ORDER — EMTRICITABINE-TENOFOVIR DF 200-300 MG PO TABS: 1.0000 | ORAL_TABLET | Freq: Every day | ORAL | Status: DC

## 2012-04-06 MED ORDER — RALTEGRAVIR POTASSIUM 400 MG PO TABS: 400.0000 mg | ORAL_TABLET | Freq: Two times a day (BID) | ORAL | Status: DC

## 2012-05-12 ENCOUNTER — Other Ambulatory Visit: Payer: Self-pay | Admitting: Licensed Clinical Social Worker

## 2012-05-12 DIAGNOSIS — B20 Human immunodeficiency virus [HIV] disease: Secondary | ICD-10-CM

## 2012-05-12 MED ORDER — RALTEGRAVIR POTASSIUM 400 MG PO TABS: 400.0000 mg | ORAL_TABLET | Freq: Two times a day (BID) | ORAL | Status: DC

## 2012-05-12 MED ORDER — EMTRICITABINE-TENOFOVIR DF 200-300 MG PO TABS: 1.0000 | ORAL_TABLET | Freq: Every day | ORAL | Status: DC

## 2012-05-12 MED ORDER — SULFAMETHOXAZOLE-TRIMETHOPRIM 800-160 MG PO TABS: 1.0000 | ORAL_TABLET | Freq: Every day | ORAL | Status: DC

## 2012-08-20 ENCOUNTER — Encounter: Payer: Self-pay | Admitting: Infectious Diseases

## 2012-09-04 ENCOUNTER — Emergency Department (HOSPITAL_COMMUNITY)
Admission: EM | Admit: 2012-09-04 | Discharge: 2012-09-04 | Disposition: A | Discharge status: Home / Self Care | Payer: Managed Care, Other (non HMO) | Attending: Emergency Medicine | Admitting: Emergency Medicine

## 2012-09-04 ENCOUNTER — Encounter (HOSPITAL_COMMUNITY): Payer: Self-pay | Admitting: *Deleted

## 2012-09-04 ENCOUNTER — Emergency Department (HOSPITAL_COMMUNITY): Payer: Managed Care, Other (non HMO)

## 2012-09-04 DIAGNOSIS — R0789 Other chest pain: Secondary | ICD-10-CM | POA: Insufficient documentation

## 2012-09-04 DIAGNOSIS — F17200 Nicotine dependence, unspecified, uncomplicated: Secondary | ICD-10-CM | POA: Insufficient documentation

## 2012-09-04 DIAGNOSIS — B20 Human immunodeficiency virus [HIV] disease: Secondary | ICD-10-CM | POA: Insufficient documentation

## 2012-09-04 DIAGNOSIS — R42 Dizziness and giddiness: Secondary | ICD-10-CM | POA: Insufficient documentation

## 2012-09-04 DIAGNOSIS — I10 Essential (primary) hypertension: Secondary | ICD-10-CM

## 2012-09-04 DIAGNOSIS — Z79899 Other long term (current) drug therapy: Secondary | ICD-10-CM | POA: Insufficient documentation

## 2012-09-04 DIAGNOSIS — Z8619 Personal history of other infectious and parasitic diseases: Secondary | ICD-10-CM | POA: Insufficient documentation

## 2012-09-04 LAB — CBC WITH DIFFERENTIAL/PLATELET
Basophils Absolute: 0 10*3/uL (ref 0.0–0.1)
Basophils Relative: 1 % (ref 0–1)
HCT: 51.1 % (ref 39.0–52.0)
Hemoglobin: 18.1 g/dL — ABNORMAL HIGH (ref 13.0–17.0)
Lymphocytes Relative: 38 % (ref 12–46)
Monocytes Absolute: 0.3 10*3/uL (ref 0.1–1.0)
Monocytes Relative: 8 % (ref 3–12)
Neutro Abs: 2 10*3/uL (ref 1.7–7.7)
Neutrophils Relative %: 52 % (ref 43–77)
RDW: 15.4 % (ref 11.5–15.5)
WBC: 3.8 10*3/uL — ABNORMAL LOW (ref 4.0–10.5)

## 2012-09-04 LAB — COMPREHENSIVE METABOLIC PANEL
AST: 25 U/L (ref 0–37)
Albumin: 4.2 g/dL (ref 3.5–5.2)
Alkaline Phosphatase: 94 U/L (ref 39–117)
CO2: 29 mEq/L (ref 19–32)
Chloride: 101 mEq/L (ref 96–112)
Creatinine, Ser: 1.39 mg/dL — ABNORMAL HIGH (ref 0.50–1.35)
GFR calc non Af Amer: 64 mL/min — ABNORMAL LOW (ref >90)
Potassium: 4.3 mEq/L (ref 3.5–5.1)
Total Bilirubin: 0.8 mg/dL (ref 0.3–1.2)

## 2012-09-04 MED ORDER — HYDROCHLOROTHIAZIDE 25 MG PO TABS: 25.0000 mg | ORAL_TABLET | Freq: Every day | ORAL | Status: DC

## 2012-09-04 NOTE — ED Notes (Signed)
Pt c/o feeling dizzy and having some chest tightness and nausea for past 2 days.  Reports last night felt very lightheaded at work and when checked his bp it was elevated.  BP continued to be elevated today.  Denies any SOB.  Denies cp radiating anywhere else.  Describes cp as tightness in chest.

## 2012-09-04 NOTE — ED Provider Notes (Signed)
History   This chart was scribed for Ronald Octave, MD by Gerlean Ren, ED Scribe. This patient was seen in room APA05/APA05 and the patient's care was started at 4:11 PM    CSN: [id number]  Arrival date & time 09/04/12  1447   First MD Initiated Contact with Patient 09/04/12 1609      Chief Complaint  Patient presents with  . Hypertension  . Dizziness     The history is provided by the patient. No language interpreter was used.   Ronald [name] is a 37 y.o. male with h/o HIV who presents to the Emergency Department complaining of HTN measured at work yesterday at 163/99 and later that day at ??/110 that he believes may be related to his cold [place] quit from 10 years of everyday tobacco use 2 days ago.  Pt first checked BP because he was experiencing constant chest pressure with no modifying factors over the previous 2 days with mild associated dizziness but denies any associated chest pain, fall, nausea, emesis, dyspnea.  Chest pressure present at bedside.  Pt states he was previously on HTN medication but is currently off and it will sporadically come on for no obvious reasons.  Pt denies changes in food or fluid intake and changes in urinary output or bowel movements.     Past Medical History  Diagnosis Date  . HIV infection   . 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 No past surgeries   . Examination under anesthesia 12/27/2011    Procedure: EXAM UNDER ANESTHESIA;  Surgeon: Shelly Rubenstein, MD;  Location: [facility];  Service: General;  Laterality: N/A;  . Wart fulguration 12/27/2011    Procedure: FULGURATION ANAL WART;  Surgeon: Shelly Rubenstein, MD;  Location: [facility];  Service: General;  Laterality: N/A;  Excision anal condyloma    Family History  Problem Relation Age of Onset  . Hypertension Mother   . Cancer Father   . Diabetes Maternal Aunt   . Seizures Paternal Aunt     History  Substance Use Topics    . Smoking status: Current Every Day Smoker -- 10 years    Types: Cigarettes  . Smokeless tobacco: Never Used     Comment: 5-6 cig./day  . Alcohol Use: Yes     Comment: occasionally      Review of Systems A complete 10 system review of systems was obtained and all systems are negative except as noted in the HPI and PMH.   Allergies  Ibuprofen  Home Medications   Current Outpatient Rx  Name  Route  Sig  Dispense  Refill  . EMTRICITABINE-TENOFOVIR 200-300 MG PO TABS   Oral   Take 1 tablet by mouth daily.   30 tablet   5   . RALTEGRAVIR POTASSIUM 400 MG PO TABS   Oral   Take 1 tablet (400 mg total) by mouth 2 (two) times daily.   60 tablet   5   . SULFAMETHOXAZOLE-TRIMETHOPRIM 800-160 MG PO TABS   Oral   Take 1 tablet by mouth daily.   30 tablet   5   . HYDROCHLOROTHIAZIDE 25 MG PO TABS   Oral   Take 1 tablet (25 mg total) by mouth daily.   30 tablet   0     BP 140/104  Pulse 67  Temp 98.4 F (36.9 C) (Oral)  Resp 18  Ht 5\' 6"  (1.676 m)  Wt 184  lb (83.462 kg)  BMI 29.70 kg/m2  SpO2 100%  Physical Exam  Nursing note and vitals reviewed. Constitutional: He is oriented to person, place, and time. He appears well-developed and well-nourished.  HENT:  Head: Normocephalic and atraumatic.  Eyes: Conjunctivae normal are normal.  Neck: Normal range of motion. No tracheal deviation present.  Cardiovascular: Normal rate, regular rhythm and normal heart sounds.   No murmur heard. Pulmonary/Chest: Effort normal and breath sounds normal. He has no wheezes.  Abdominal: Soft. Bowel sounds are normal. There is no tenderness.  Musculoskeletal: Normal range of motion. He exhibits no edema.  Neurological: He is alert and oriented to person, place, and time.       Cranial nerves 2-12 intact, 5/5 strength throughout, no ataxia on finger-to-nose, normal gait.  Skin: Skin is warm and dry.  Psychiatric: He has a normal mood and affect.    ED Course  Procedures  (including critical care time) DIAGNOSTIC STUDIES: Oxygen Saturation is 100% on room air, normal by my interpretation.    COORDINATION OF CARE: 4:18 PM- Patient informed of clinical course, understands medical decision-making process, and agrees with plan.  Ordered CBC, c-met, troponin I, urinalysis, EKG, and chest XR.  Labs Reviewed  CBC WITH DIFFERENTIAL - Abnormal; Notable for the following:    WBC 3.8 (*)     RBC 6.01 (*)     Hemoglobin 18.1 (*)     All other components within normal limits  COMPREHENSIVE METABOLIC PANEL - Abnormal; Notable for the following:    Glucose, Bld 100 (*)     Creatinine, Ser 1.39 (*)     GFR calc non Af Amer 64 (*)     GFR calc Af Amer 75 (*)     All other components within normal limits  TROPONIN I  URINALYSIS, ROUTINE W REFLEX MICROSCOPIC   Dg Chest 2 View  09/04/2012  *RADIOLOGY REPORT*  Clinical Data: Chest pain  CHEST - 2 VIEW  Comparison: CT chest 01/26/2010, chest radiograph 01/09/2008  Findings: Cardiomediastinal silhouette is within normal limits. The lungs are clear. No pleural effusion.  No pneumothorax.  No acute osseous abnormality.  IMPRESSION: Normal chest.   Original Report Authenticated By: Christiana Pellant, M.D.      1. Hypertension   2. Dizziness       MDM  History of HIV presenting with elevated blood pressure for the past 3 days associated with constant chest pressure and dizziness. Had been on HCTZ previously but taken off.  No change in HIV meds.  Recently quit smoking.  No chest pain, SOB, headache, vision changes. Chest pressure has been constant for 3 days and is atypical for ACS.   Cr At baseline. No evidence of endorgan damage. Blood pressure mildly elevated in the 140/100 range. Suspect related to recent cessation of nicotine.  Will restart previous dose of HCTZ and have followup with PCP next week.    Date: 09/04/2012  Rate: 64  Rhythm: normal sinus rhythm  QRS Axis: normal  Intervals: normal  ST/T Wave  abnormalities: normal  Conduction Disutrbances:none  Narrative Interpretation:   Old EKG Reviewed: unchanged     I personally performed the services described in this documentation, which was scribed in my presence. The recorded information has been reviewed and is accurate.         Ronald Octave, MD 09/04/12 1800

## 2012-09-04 NOTE — ED Notes (Addendum)
Pt states BP has been elevated for a few days, feels dizzy at times and has pressure without pain in chest. Pt is HIV positive. Pt states this has happened before and he was placed on BP meds for a short time.

## 2012-11-11 ENCOUNTER — Other Ambulatory Visit: Payer: Managed Care, Other (non HMO)

## 2012-11-11 ENCOUNTER — Ambulatory Visit: Payer: BC Managed Care – PPO

## 2012-11-11 ENCOUNTER — Other Ambulatory Visit: Payer: Self-pay | Admitting: Internal Medicine

## 2012-11-11 DIAGNOSIS — Z113 Encounter for screening for infections with a predominantly sexual mode of transmission: Secondary | ICD-10-CM

## 2012-11-11 DIAGNOSIS — B20 Human immunodeficiency virus [HIV] disease: Secondary | ICD-10-CM

## 2012-11-11 LAB — CBC WITH DIFFERENTIAL/PLATELET
Basophils Relative: 1 % (ref 0–1)
HCT: 50.5 % (ref 39.0–52.0)
Hemoglobin: 17.6 g/dL — ABNORMAL HIGH (ref 13.0–17.0)
Lymphocytes Relative: 39 % (ref 12–46)
Lymphs Abs: 1.9 10*3/uL (ref 0.7–4.0)
MCHC: 34.9 g/dL (ref 30.0–36.0)
Monocytes Absolute: 0.4 10*3/uL (ref 0.1–1.0)
Monocytes Relative: 9 % (ref 3–12)
Neutro Abs: 2.4 10*3/uL (ref 1.7–7.7)
Neutrophils Relative %: 49 % (ref 43–77)
RBC: 5.97 MIL/uL — ABNORMAL HIGH (ref 4.22–5.81)
WBC: 4.9 10*3/uL (ref 4.0–10.5)

## 2012-11-11 LAB — COMPREHENSIVE METABOLIC PANEL
Albumin: 4.4 g/dL (ref 3.5–5.2)
BUN: 13 mg/dL (ref 6–23)
CO2: 29 mEq/L (ref 19–32)
Calcium: 10 mg/dL (ref 8.4–10.5)
Chloride: 99 mEq/L (ref 96–112)
Creat: 1.18 mg/dL (ref 0.50–1.35)
Glucose, Bld: 98 mg/dL (ref 70–99)
Potassium: 4.5 mEq/L (ref 3.5–5.3)

## 2012-11-11 LAB — RPR

## 2012-11-12 LAB — T-HELPER CELL (CD4) - (RCID CLINIC ONLY): CD4 T Cell Abs: 390 uL — ABNORMAL LOW (ref 400–2700)

## 2012-11-12 LAB — HIV-1 RNA QUANT-NO REFLEX-BLD: HIV 1 RNA Quant: <20 copies/mL (ref <20)

## 2012-11-24 ENCOUNTER — Ambulatory Visit (INDEPENDENT_AMBULATORY_CARE_PROVIDER_SITE_OTHER): Payer: Managed Care, Other (non HMO) | Admitting: Infectious Disease

## 2012-11-24 ENCOUNTER — Encounter: Payer: Self-pay | Admitting: Infectious Disease

## 2012-11-24 VITALS — BP 167/115 | HR 90 | Temp 98.6°F | Ht 66.0 in | Wt 189.0 lb

## 2012-11-24 DIAGNOSIS — I10 Essential (primary) hypertension: Secondary | ICD-10-CM

## 2012-11-24 DIAGNOSIS — B20 Human immunodeficiency virus [HIV] disease: Secondary | ICD-10-CM

## 2012-11-24 NOTE — Patient Instructions (Addendum)
Make RN visit for repeat BP check and sampling of urine in next 2 weeks  If you can purchase a BP cuff monitor your bp at home and keep a log

## 2012-11-24 NOTE — Progress Notes (Signed)
  Subjective:    Patient ID: Ronald [name], male    DOB: [birth year], 37 y.o.   MRN: [id number]  HPI  Ronald [name] is a 37 y.o. male with HIV infection who is doing superbly well on their  antiviral regimen isentress and truvada, with undetectable viral load and health cd4 count.   He has been living in [place] and having drugs mailed to him but is now moving back to [place] and now has private insurance.  He did try to stretch out his meds and skipped meds every 3 days. I ASKED HIM TO PLEASE NOT DO THIS and warned vs low barrier to genetic resistance of this regimen.  We reviewed other STR and other [facility] reviewed GSK/ViiV "Stiiving" switch study.   I spent greater than 45 minutes with the patient including greater than 50% of time in face to face counsel of the patient and in coordination of their care.    Review of Systems  Constitutional: Negative for fever, chills, diaphoresis, activity change, appetite change, fatigue and unexpected weight change.  HENT: Negative for congestion, sore throat, rhinorrhea, sneezing, trouble swallowing and sinus pressure.   Eyes: Negative for photophobia and visual disturbance.  Respiratory: Negative for cough, chest tightness, shortness of breath, wheezing and stridor.   Cardiovascular: Negative for chest pain, palpitations and leg swelling.  Gastrointestinal: Negative for nausea, vomiting, abdominal pain, diarrhea, constipation, blood in stool, abdominal distention and anal bleeding.  Genitourinary: Negative for dysuria, hematuria, flank pain and difficulty urinating.  Musculoskeletal: Negative for myalgias, back pain, joint swelling, arthralgias and gait problem.  Skin: Negative for color change, pallor, rash and wound.  Neurological: Negative for dizziness, tremors, weakness and light-headedness.  Hematological: Negative for adenopathy. Does not bruise/bleed easily.  Psychiatric/Behavioral: Negative for behavioral problems, confusion, sleep disturbance,  dysphoric mood, decreased concentration and agitation.       Objective:   Physical Exam  Constitutional: He is oriented to person, place, and time. He appears well-developed and well-nourished. No distress.  HENT:  Head: Normocephalic and atraumatic.  Mouth/Throat: Oropharynx is clear and moist. No oropharyngeal exudate.  Eyes: Conjunctivae and EOM are normal. Pupils are equal, round, and reactive to light. No scleral icterus.  Neck: Normal range of motion. Neck supple. No JVD present.  Cardiovascular: Normal rate, regular rhythm and normal heart sounds.  Exam reveals no gallop and no friction rub.   No murmur heard. Pulmonary/Chest: Effort normal and breath sounds normal. No respiratory distress. He has no wheezes. He has no rales. He exhibits no tenderness.  Abdominal: He exhibits no distension and no mass. There is no tenderness. There is no rebound and no guarding.  Musculoskeletal: He exhibits no edema and no tenderness.  Lymphadenopathy:    He has no cervical adenopathy.  Neurological: He is alert and oriented to person, place, and time. He has normal reflexes. He exhibits normal muscle tone. Coordination normal.  Skin: Skin is warm and dry. He is not diaphoretic. No erythema. No pallor.  Psychiatric: He has a normal mood and affect. His behavior is normal. Judgment and thought content normal.          Assessment & Plan:   HIV: continue current regimen. Consider "Striiving" study He has no R mutations documented. Genotype failed to work when tried in 2010  HTN: continue HCTZ, check bp in next 2 weeks RN visit and check microalbumin to creatinine ratio

## 2012-11-25 ENCOUNTER — Ambulatory Visit: Payer: Managed Care, Other (non HMO) | Admitting: Infectious Disease

## 2012-12-15 ENCOUNTER — Other Ambulatory Visit: Payer: Self-pay | Admitting: *Deleted

## 2012-12-15 DIAGNOSIS — I10 Essential (primary) hypertension: Secondary | ICD-10-CM

## 2012-12-16 ENCOUNTER — Other Ambulatory Visit: Payer: Self-pay

## 2012-12-16 ENCOUNTER — Encounter: Payer: Self-pay | Admitting: *Deleted

## 2012-12-16 NOTE — Progress Notes (Signed)
Patient ID: Ronald [name], male   DOB: Apr 22, 1976, 37 y.o.   MRN: [id number] Worried about having lab work done today because he lost his insurance and possible bill.  Spoke with B. Jeraldine Loots and has scheduled an appt to come back next week to sign up for [employer] and ADAP.  Lab work visit rescheduled for the pt for the same day.  Order in [facility] for lab work.

## 2012-12-22 ENCOUNTER — Other Ambulatory Visit: Payer: Self-pay

## 2012-12-22 ENCOUNTER — Ambulatory Visit: Payer: Self-pay

## 2013-02-10 ENCOUNTER — Ambulatory Visit: Payer: Self-pay

## 2013-02-10 ENCOUNTER — Other Ambulatory Visit: Payer: Managed Care, Other (non HMO)

## 2013-02-24 ENCOUNTER — Ambulatory Visit: Payer: Self-pay

## 2013-02-24 ENCOUNTER — Ambulatory Visit: Payer: Managed Care, Other (non HMO) | Admitting: Infectious Disease

## 2013-07-08 ENCOUNTER — Other Ambulatory Visit: Payer: Self-pay

## 2013-07-12 ENCOUNTER — Telehealth: Payer: Self-pay | Admitting: *Deleted

## 2013-07-12 NOTE — Telephone Encounter (Signed)
Requested pt call [facility] for lab and MD appts.  Number given.

## 2017-11-05 ENCOUNTER — Other Ambulatory Visit: Payer: Self-pay

## 2017-11-05 ENCOUNTER — Ambulatory Visit: Payer: Self-pay

## 2017-11-05 ENCOUNTER — Encounter: Payer: Self-pay | Admitting: Infectious Disease

## 2017-11-05 DIAGNOSIS — Z113 Encounter for screening for infections with a predominantly sexual mode of transmission: Secondary | ICD-10-CM

## 2017-11-05 DIAGNOSIS — B20 Human immunodeficiency virus [HIV] disease: Secondary | ICD-10-CM

## 2017-11-05 DIAGNOSIS — Z79899 Other long term (current) drug therapy: Secondary | ICD-10-CM

## 2017-11-06 LAB — URINALYSIS
Bilirubin Urine: NEGATIVE
Glucose, UA: NEGATIVE
Ketones, ur: NEGATIVE
LEUKOCYTES UA: NEGATIVE
Nitrite: NEGATIVE
Specific Gravity, Urine: 1.016 (ref 1.001–1.03)
pH: 6 (ref 5.0–8.0)

## 2017-11-06 LAB — URINE CYTOLOGY ANCILLARY ONLY
Chlamydia: NEGATIVE
NEISSERIA GONORRHEA: NEGATIVE

## 2017-11-06 LAB — T-HELPER CELL (CD4) - (RCID CLINIC ONLY)
CD4 % Helper T Cell: 3 % — ABNORMAL LOW (ref 33–55)
CD4 T Cell Abs: 40 /uL — ABNORMAL LOW (ref 400–2700)

## 2017-11-07 LAB — QUANTIFERON-TB GOLD PLUS
Mitogen-NIL: 9.98 IU/mL
NIL: 0.03 IU/mL
QuantiFERON-TB Gold Plus: NEGATIVE
TB1-NIL: <0 IU/mL
TB2-NIL: <0 IU/mL

## 2017-11-10 LAB — HEPATITIS B SURFACE ANTIBODY,QUALITATIVE: Hep B S Ab: REACTIVE — AB

## 2017-11-10 LAB — CBC WITH DIFFERENTIAL/PLATELET
BASOS ABS: 30 {cells}/uL (ref 0–200)
BASOS PCT: 1 %
EOS PCT: 3 %
Eosinophils Absolute: 90 cells/uL (ref 15–500)
HEMATOCRIT: 52.6 % — AB (ref 38.5–50.0)
HEMOGLOBIN: 18 g/dL — AB (ref 13.2–17.1)
LYMPHS ABS: 1197 {cells}/uL (ref 850–3900)
MCH: 28.5 pg (ref 27.0–33.0)
MCHC: 34.2 g/dL (ref 32.0–36.0)
MCV: 83.2 fL (ref 80.0–100.0)
MONOS PCT: 9.1 %
MPV: 11.2 fL (ref 7.5–12.5)
NEUTROS ABS: 1410 {cells}/uL — AB (ref 1500–7800)
Neutrophils Relative %: 47 %
Platelets: 193 10*3/uL (ref 140–400)
RBC: 6.32 10*6/uL — AB (ref 4.20–5.80)
RDW: 18.5 % — ABNORMAL HIGH (ref 11.0–15.0)
Total Lymphocyte: 39.9 %
WBC mixed population: 273 cells/uL (ref 200–950)
WBC: 3 10*3/uL — AB (ref 3.8–10.8)

## 2017-11-10 LAB — LIPID PANEL
CHOL/HDL RATIO: 3.3 (calc) (ref <5.0)
Cholesterol: 211 mg/dL — ABNORMAL HIGH (ref <200)
HDL: 63 mg/dL (ref >40)
LDL Cholesterol (Calc): 131 mg/dL (calc) — ABNORMAL HIGH
NON-HDL CHOLESTEROL (CALC): 148 mg/dL — AB (ref <130)
TRIGLYCERIDES: 78 mg/dL (ref <150)

## 2017-11-10 LAB — HEPATITIS B SURFACE ANTIGEN: Hepatitis B Surface Ag: NONREACTIVE

## 2017-11-10 LAB — COMPLETE METABOLIC PANEL WITH GFR
AG Ratio: 1.1 (calc) (ref 1.0–2.5)
ALT: 57 U/L — AB (ref 9–46)
AST: 71 U/L — AB (ref 10–40)
Albumin: 4.1 g/dL (ref 3.6–5.1)
Alkaline phosphatase (APISO): 69 U/L (ref 40–115)
BILIRUBIN TOTAL: 0.3 mg/dL (ref 0.2–1.2)
BUN: 11 mg/dL (ref 7–25)
CHLORIDE: 102 mmol/L (ref 98–110)
CO2: 29 mmol/L (ref 20–32)
Calcium: 9.4 mg/dL (ref 8.6–10.3)
Creat: 1.14 mg/dL (ref 0.60–1.35)
GFR, EST AFRICAN AMERICAN: 93 mL/min/{1.73_m2} (ref >=60)
GFR, Est Non African American: 80 mL/min/{1.73_m2} (ref >=60)
Globulin: 3.9 g/dL (calc) — ABNORMAL HIGH (ref 1.9–3.7)
Glucose, Bld: 93 mg/dL (ref 65–99)
Potassium: 4.3 mmol/L (ref 3.5–5.3)
Sodium: 138 mmol/L (ref 135–146)
TOTAL PROTEIN: 8 g/dL (ref 6.1–8.1)

## 2017-11-10 LAB — HEPATITIS C ANTIBODY
Hepatitis C Ab: NONREACTIVE
SIGNAL TO CUT-OFF: 0.06 (ref <1.00)

## 2017-11-10 LAB — HEPATITIS B CORE ANTIBODY, TOTAL: HEP B C TOTAL AB: NONREACTIVE

## 2017-11-10 LAB — RPR: RPR Ser Ql: NONREACTIVE

## 2017-11-10 LAB — HLA B*5701: HLA-B 5701 W/RFLX HLA-B HIGH: NEGATIVE

## 2017-11-10 LAB — HEPATITIS A ANTIBODY, TOTAL: HEPATITIS A AB,TOTAL: NONREACTIVE

## 2017-11-10 NOTE — Progress Notes (Signed)
Left message on machine to call our office for earlier appointment with pharmacy per Dr Daiva EvesVan Dam.

## 2017-11-11 ENCOUNTER — Ambulatory Visit (INDEPENDENT_AMBULATORY_CARE_PROVIDER_SITE_OTHER): Payer: Self-pay | Admitting: Pharmacist Clinician (PhC)/ Clinical Pharmacy Specialist

## 2017-11-11 DIAGNOSIS — B20 Human immunodeficiency virus [HIV] disease: Secondary | ICD-10-CM

## 2017-11-11 MED ORDER — BICTEGRAVIR-EMTRICITAB-TENOFOV 50-200-25 MG PO TABS: 1.0000 | ORAL_TABLET | Freq: Every day | ORAL | 1 refills | Status: DC

## 2017-11-11 MED ORDER — SULFAMETHOXAZOLE-TRIMETHOPRIM 400-80 MG PO TABS: 1.0000 | ORAL_TABLET | Freq: Every day | ORAL | 1 refills | Status: DC

## 2017-11-11 MED FILL — SULFAMETHOXAZOLE/TMP SS TAB: 400-80 | 30 days supply | Qty: 30 | Fill #0 | Status: TO

## 2017-11-11 MED FILL — BIKTARVY 50-200-25 MG TABS: 50-200-25 | 30 days supply | Qty: 30 | Fill #0 | Status: TO

## 2017-11-11 NOTE — Progress Notes (Signed)
HPI: Ronald [name] is a 42 y.o. male who is here to f/u with pharmacy to start meds.   Allergies: Allergies  Allergen Reactions  . Ibuprofen Other (See Comments)    BLISTERS    Vitals:    Past Medical History: Past Medical History:  Diagnosis Date  . 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 HIV infection     Social History: Social History   Socioeconomic History  . Marital status: Single    Spouse name: Not on file  . Number of children: Not on file  . Years of education: Not on file  . Highest education level: Not on file  Social Needs  . Financial resource strain: Not on file  . Food insecurity - worry: Not on file  . Food insecurity - inability: Not on file  . Transportation needs - medical: Not on file  . Transportation needs - non-medical: Not on file  Occupational History  . Occupation: Emergency planning/management officer: [employer]  Tobacco Use  . Smoking status: Current Every Day Smoker    Years: 10.00    Types: Cigarettes  . Smokeless tobacco: Never Used  . Tobacco comment: 5-6 cig./day  Substance and Sexual Activity  . Alcohol use: Yes    Comment: occasionally  . Drug use: No  . Sexual activity: Not Currently    Comment: refused condoms  Other Topics Concern  . Not on file  Social History Narrative   Lives with [place]    Previous Regimen: Isentress/Truvada>>Tivicay/Descovy  Current Regimen: Off  Labs: HIV 1 RNA Quant  Date Value  11/05/2017 43,600 Copies/mL (H)  11/11/2012 <20 copies/mL  10/10/2011 55 copies/mL (H)   CD4 T Cell Abs  Date Value  11/05/2017 40 /uL (L)  11/11/2012 390 cmm (L)  10/10/2011 180 cmm (L)   Hep B S Ab (no units)  Date Value  11/05/2017 REACTIVE (A)   Hepatitis B Surface Ag (no units)  Date Value  11/05/2017 NON-REACTIVE   HCV Ab (no units)  Date Value  06/04/2010 NEG    CrCl: CrCl cannot be calculated (Unknown ideal weight.).  Lipids:    Component Value Date/Time   CHOL 211 (H) 11/05/2017 1158   TRIG 78  11/05/2017 1158   HDL 63 11/05/2017 1158   CHOLHDL 3.3 11/05/2017 1158   VLDL 18 10/10/2011 1130   LDLCALC 131 (H) 11/05/2017 1158    Assessment: Ronald [name] last saw [place] here in 2014 before moving to [place]. Once he is up there, his antiretrovirals were changed from Isentress/Truvada to Tivicay/Descovy. He said he was missing dose here and there but not many. 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 He moved back here about 8 months ago but never re-established care with [place]. His most recent labs here showed a CD4 of 40, therefore, he was brought back today to restart meds with pharmacy. Apparently, he is about to start a new job but insurance will not kick in for at least 30 days. 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 He has filled out the [employer] application. Got him the 30d supply of Biktarvy today but will submit his full application also, just in case the ADAP won't be approved in time.   He got $20 on him today so got [facility] pharmacy to sell him the Bactrim for $5. He'll pick up both today and start it. He will come back for his follow up on 3/25 with Dr. Daiva Eves. Advised him  to establish with a primary care physician once his insurance is active. Told him that primary care is available upstair.   Recommendations:  Start Biktarvy 1 PO qday Start Bactrim SS 1 PO qday F/u with Dr. Daiva EvesVan Dam on 3/25  Ronald [name], PharmD, BCPS, AAHIVP, CPP Clinical Infectious Disease Pharmacist [facility] 11/11/2017, 3:13 PM

## 2017-11-19 LAB — HIV-1 RNA ULTRAQUANT REFLEX TO GENTYP+
HIV 1 RNA Quant: 43600 Copies/mL — ABNORMAL HIGH
HIV-1 RNA QUANT, LOG: 4.64 {Log_copies}/mL — AB

## 2017-11-19 LAB — RFLX HIV-1 INTEGRASE GENOTYPE: HIV-1 Genotype: NOT DETECTED

## 2017-11-24 ENCOUNTER — Ambulatory Visit (INDEPENDENT_AMBULATORY_CARE_PROVIDER_SITE_OTHER): Payer: Self-pay | Admitting: Pharmacist Clinician (PhC)/ Clinical Pharmacy Specialist

## 2017-11-24 ENCOUNTER — Ambulatory Visit (INDEPENDENT_AMBULATORY_CARE_PROVIDER_SITE_OTHER): Payer: Self-pay | Admitting: Infectious Disease

## 2017-11-24 ENCOUNTER — Encounter: Payer: Self-pay | Admitting: Infectious Disease

## 2017-11-24 VITALS — BP 154/115 | HR 92 | Temp 98.0°F | Ht 66.0 in | Wt 179.0 lb

## 2017-11-24 DIAGNOSIS — B20 Human immunodeficiency virus [HIV] disease: Secondary | ICD-10-CM

## 2017-11-24 DIAGNOSIS — Z23 Encounter for immunization: Secondary | ICD-10-CM

## 2017-11-24 MED ORDER — SULFAMETHOXAZOLE-TRIMETHOPRIM 400-80 MG PO TABS: 1.0000 | ORAL_TABLET | Freq: Every day | ORAL | 1 refills | Status: DC

## 2017-11-24 MED ORDER — BICTEGRAVIR-EMTRICITAB-TENOFOV 50-200-25 MG PO TABS: 1.0000 | ORAL_TABLET | Freq: Every day | ORAL | 11 refills | Status: DC

## 2017-11-24 MED ORDER — SULFAMETHOXAZOLE-TRIMETHOPRIM 400-80 MG PO TABS: 1.0000 | ORAL_TABLET | Freq: Every day | ORAL | 4 refills | Status: DC

## 2017-11-24 NOTE — Progress Notes (Signed)
HPI: Ronald [name] is a 42 y.o. male who is here to see Dr Daiva EvesVan Dam for his HIV  Allergies: Allergies  Allergen Reactions  . Ibuprofen Other (See Comments)    BLISTERS    Vitals: Temp: 98 F (36.7 C) (03/25 1409) Temp Source: Oral (03/25 1409) BP: 154/115 (03/25 1409) Pulse Rate: 92 (03/25 1409)  Past Medical History: Past Medical History:  Diagnosis Date  . 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 HIV infection [facility])     Social History: Social History   Socioeconomic History  . Marital status: Single    Spouse name: Not on file  . Number of children: Not on file  . Years of education: Not on file  . Highest education level: Not on file  Occupational History  . Occupation: Emergency planning/management officerursing Assistant    [employer]er: [employer]  Social Needs  . Financial resource strain: Not on file  . Food insecurity:    Worry: Not on file    Inability: Not on file  . Transportation needs:    Medical: Not on file    Non-medical: Not on file  Tobacco Use  . Smoking status: Current Every Day Smoker    Years: 10.00    Types: Cigarettes  . Smokeless tobacco: Never Used  . Tobacco comment: 5-6 cig./day  Substance and Sexual Activity  . Alcohol use: Yes    Comment: occasionally  . Drug use: No    Frequency: 1.0 times per week  . Sexual activity: Not Currently    Comment: refused condoms  Lifestyle  . Physical activity:    Days per week: Not on file    Minutes per session: Not on file  . Stress: Not on file  Relationships  . Social connections:    Talks on phone: Not on file    Gets together: Not on file    Attends religious service: Not on file    Active member of club or organization: Not on file    Attends meetings of clubs or organizations: Not on file    Relationship status: Not on file  Other Topics Concern  . Not on file  Social History Narrative   Lives with [place]    Previous Regimen: Isentress/Truvada>>Tivicay/Descovy   Current Regimen: Biktarvy  Labs: HIV 1 RNA Quant  Date  Value  11/05/2017 43,600 Copies/mL (H)  11/11/2012 <20 copies/mL  10/10/2011 55 copies/mL (H)   CD4 T Cell Abs  Date Value  11/05/2017 40 /uL (L)  11/11/2012 390 cmm (L)  10/10/2011 180 cmm (L)   Hep B S Ab (no units)  Date Value  11/05/2017 REACTIVE (A)   Hepatitis B Surface Ag (no units)  Date Value  11/05/2017 NON-REACTIVE   HCV Ab (no units)  Date Value  06/04/2010 NEG    CrCl: Estimated Creatinine Clearance: 86.3 mL/min (by C-G formula based on SCr of 1.14 mg/dL).  Lipids:    Component Value Date/Time   CHOL 211 (H) 11/05/2017 1158   TRIG 78 11/05/2017 1158   HDL 63 11/05/2017 1158   CHOLHDL 3.3 11/05/2017 1158   VLDL 18 10/10/2011 1130   LDLCALC 131 (H) 11/05/2017 1158    Assessment: Ronald [name] has started on his [place] and Septra. He got the supply through [employer]. He is also approved for beyond 30d supply. His ADAP is still not approved yet. We will need send the bridge supply to [facility] now. [facility] [place] so the can mail it  to him along with his septra.   Recommendations:  Biktarvy to [facility] for now until ADAP Continue Septra SS 1 daily  Ronald [name], PharmD, BCPS, AAHIVP, CPP Clinical Infectious Disease Pharmacist [facility] 11/24/2017, 4:54 PM

## 2017-11-24 NOTE — Addendum Note (Signed)
Addended by: Ulyses SouthwardPHAM, Pailyn Bellevue Q on: 11/24/2017 05:00 PM   Modules accepted: Orders

## 2017-11-24 NOTE — Progress Notes (Signed)
Chief complaint: here to re-establish care for HIV/AIDS  Subjective:    Patient ID: Ronald [name], male    DOB: 27-Jul-1976, 42 y.o.   MRN: [id number]  HPI   Ronald [name] is a 42 y.o. male with HIV infection who had been doing superbly well on their  antiviral regimen isentress and truvada, with undetectable viral load and health cd4 count when I last saw him FIVE years ago.  He had been living in [place] and at times was having his meds shipped to him and he did try also to stretch them out which I admonished him not to do. Ultimately he moved to [place] where he was in care and was on Tivicay and Descovy and well controlled. He had DVT and was on anticoagulation. [place] but did not re-establish care until this month and was without ARV for 8 months. When he came in for labs his VL was 45k range and CD4 had plummeted to 40. I was worried about him and he was brought in and seen by ID pharmacy who initiated BIKTARVY 30 day supply. He also applied for RW and HMAP and qualified for [facility] pt assistance program.  He has been on BIKTARVY since then (a few weeks) along with Bactrim for PCP prevention.  Lab Results  Component Value Date   HIV1RNAQUANT 43,600 (H) 11/05/2017   HIV1RNAQUANT <20 11/11/2012   HIV1RNAQUANT 55 (H) 10/10/2011   Lab Results  Component Value Date   CD4TABS 40 (L) 11/05/2017   CD4TABS 390 (L) 11/11/2012   CD4TABS 180 (L) 10/10/2011   Past Medical History:  Diagnosis Date  . 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 HIV infection [facility])     Past Surgical History:  Procedure Laterality Date  . EXAMINATION UNDER ANESTHESIA  12/27/2011   Procedure: EXAM UNDER ANESTHESIA;  Surgeon: Shelly Rubenstein, MD;  Location: [facility];  Service: General;  Laterality: N/A;  . NO PAST SURGERIES    . WART FULGURATION  12/27/2011   Procedure: FULGURATION ANAL WART;  Surgeon: Shelly Rubenstein, MD;  Location: [facility];  Service: General;   Laterality: N/A;  Excision anal condyloma    Family History  Problem Relation Age of Onset  . Hypertension Mother   . Cancer Father   . Diabetes Maternal Aunt   . Seizures Paternal Aunt       Social History   Socioeconomic History  . Marital status: Single    Spouse name: Not on file  . Number of children: Not on file  . Years of education: Not on file  . Highest education level: Not on file  Occupational History  . Occupation: Emergency planning/management officer: Field seismologist  Social Needs  . Financial resource strain: Not on file  . Food insecurity:    Worry: Not on file    Inability: Not on file  . Transportation needs:    Medical: Not on file    Non-medical: Not on file  Tobacco Use  . Smoking status: Current Every Day Smoker    Years: 10.00    Types: Cigarettes  . Smokeless tobacco: Never Used  . Tobacco comment: 5-6 cig./day  Substance and Sexual Activity  . Alcohol use: Yes    Comment: occasionally  . Drug use: No    Frequency: 1.0 times per week  . Sexual activity: Not Currently    Comment: refused condoms  Lifestyle  . Physical activity:    Days  per week: Not on file    Minutes per session: Not on file  . Stress: Not on file  Relationships  . Social connections:    Talks on phone: Not on file    Gets together: Not on file    Attends religious service: Not on file    Active member of club or organization: Not on file    Attends meetings of clubs or organizations: Not on file    Relationship status: Not on file  Other Topics Concern  . Not on file  Social History Narrative   Lives with [facility]    Allergies  Allergen Reactions  . Ibuprofen Other (See Comments)    BLISTERS     Current Outpatient Medications:  .  bictegravir-emtricitabine-tenofovir AF (BIKTARVY) 50-200-25 MG TABS tablet, Take 1 tablet by mouth daily., Disp: 30 tablet, Rfl: 11 .  sulfamethoxazole-trimethoprim (BACTRIM) 400-80 MG tablet, Take 1 tablet by mouth daily., Disp: 30  tablet, Rfl: 1     Review of Systems  Constitutional: Negative for activity change, appetite change, chills, diaphoresis, fatigue, fever and unexpected weight change.  HENT: Negative for congestion, rhinorrhea, sinus pressure, sneezing, sore throat and trouble swallowing.   Eyes: Negative for photophobia and visual disturbance.  Respiratory: Negative for cough, chest tightness, shortness of breath, wheezing and stridor.   Cardiovascular: Negative for chest pain, palpitations and leg swelling.  Gastrointestinal: Negative for abdominal distention, abdominal pain, anal bleeding, blood in stool, constipation, diarrhea, nausea and vomiting.  Genitourinary: Negative for difficulty urinating, dysuria, flank pain and hematuria.  Musculoskeletal: Negative for arthralgias, back pain, gait problem, joint swelling and myalgias.  Skin: Negative for color change, pallor, rash and wound.  Neurological: Negative for dizziness, tremors, weakness and light-headedness.  Hematological: Negative for adenopathy. Does not bruise/bleed easily.  Psychiatric/Behavioral: Negative for agitation, behavioral problems, confusion, decreased concentration, dysphoric mood and sleep disturbance.       Objective:   Physical Exam  Constitutional: He is oriented to person, place, and time. He appears well-developed and well-nourished. No distress.  HENT:  Head: Normocephalic and atraumatic.  Mouth/Throat: No oropharyngeal exudate.  Eyes: Conjunctivae and EOM are normal. No scleral icterus.  Neck: Normal range of motion. Neck supple.  Cardiovascular: Normal rate and regular rhythm.  Pulmonary/Chest: Effort normal. No respiratory distress. He has no wheezes.  Abdominal: He exhibits no distension.  Musculoskeletal: He exhibits no edema or tenderness.  Neurological: He is alert and oriented to person, place, and time. He exhibits normal muscle tone. Coordination normal.  Skin: Skin is warm and dry. No rash noted. He is not  diaphoretic. No erythema. No pallor.  Psychiatric: He has a normal mood and affect. His behavior is normal. Judgment and thought content normal.          Assessment & Plan:   HIV/AIDS: continue BIKTARVY. Check labs today and RTC in 1-2 months. Continue bactrim for PCP prevention  I spent greater than 30  minutes with the patient including greater than 50% of time in face to face counsel of the patient re the need to be adherent to ARV and to not start and stop meds but be on them continuously to preserve immune health and prevent transmission to others sexually and in coordination of his care.

## 2017-11-25 LAB — CBC WITH DIFFERENTIAL/PLATELET
BASOS PCT: 1 %
Basophils Absolute: 31 cells/uL (ref 0–200)
EOS ABS: 149 {cells}/uL (ref 15–500)
Eosinophils Relative: 4.8 %
HEMATOCRIT: 52.1 % — AB (ref 38.5–50.0)
HEMOGLOBIN: 17.8 g/dL — AB (ref 13.2–17.1)
LYMPHS ABS: 1234 {cells}/uL (ref 850–3900)
MCH: 28.6 pg (ref 27.0–33.0)
MCHC: 34.2 g/dL (ref 32.0–36.0)
MCV: 83.8 fL (ref 80.0–100.0)
MPV: 11.1 fL (ref 7.5–12.5)
Monocytes Relative: 12.7 %
NEUTROS ABS: 1293 {cells}/uL — AB (ref 1500–7800)
Neutrophils Relative %: 41.7 %
Platelets: 208 10*3/uL (ref 140–400)
RBC: 6.22 10*6/uL — AB (ref 4.20–5.80)
RDW: 18.6 % — ABNORMAL HIGH (ref 11.0–15.0)
Total Lymphocyte: 39.8 %
WBC: 3.1 10*3/uL — AB (ref 3.8–10.8)
WBCMIX: 394 {cells}/uL (ref 200–950)

## 2017-11-25 LAB — COMPLETE METABOLIC PANEL WITH GFR
AG RATIO: 1 (calc) (ref 1.0–2.5)
ALBUMIN MSPROF: 3.8 g/dL (ref 3.6–5.1)
ALKALINE PHOSPHATASE (APISO): 52 U/L (ref 40–115)
ALT: 35 U/L (ref 9–46)
AST: 39 U/L (ref 10–40)
BILIRUBIN TOTAL: 0.7 mg/dL (ref 0.2–1.2)
BUN: 15 mg/dL (ref 7–25)
CHLORIDE: 101 mmol/L (ref 98–110)
CO2: 26 mmol/L (ref 20–32)
Calcium: 9.3 mg/dL (ref 8.6–10.3)
Creat: 1.34 mg/dL (ref 0.60–1.35)
GFR, Est African American: 76 mL/min/{1.73_m2} (ref >=60)
GFR, Est Non African American: 66 mL/min/{1.73_m2} (ref >=60)
GLUCOSE: 105 mg/dL — AB (ref 65–99)
Globulin: 3.8 g/dL (calc) — ABNORMAL HIGH (ref 1.9–3.7)
POTASSIUM: 3.8 mmol/L (ref 3.5–5.3)
Sodium: 136 mmol/L (ref 135–146)
Total Protein: 7.6 g/dL (ref 6.1–8.1)

## 2017-11-25 LAB — SPECIMEN COMPROMISED

## 2017-11-26 LAB — T-HELPER CELL (CD4) - (RCID CLINIC ONLY)
CD4 % Helper T Cell: 4 % — ABNORMAL LOW (ref 33–55)
CD4 T CELL ABS: 60 /uL — AB (ref 400–2700)

## 2017-11-27 LAB — HIV RNA, RTPCR W/R GT (RTI, PI,INT)
HIV 1 RNA Quant: 79 copies/mL — ABNORMAL HIGH
HIV-1 RNA Quant, Log: 1.9 Log copies/mL — ABNORMAL HIGH

## 2017-12-11 MED FILL — BIKTARVY 50-200-25 MG TABS: 50-200-25 | 30 days supply | Qty: 30 | Fill #0

## 2017-12-11 MED FILL — SULFAMETHOXAZOLE-TMP SS TAB: 400-80 | 30 days supply | Qty: 30 | Fill #0

## 2018-01-16 MED FILL — BIKTARVY 50-200-25 MG TABS: 50-200-25 | 30 days supply | Qty: 30 | Fill #1

## 2018-01-16 MED FILL — SULFAMETHOXAZOLE-TMP SS TAB: 400-80 | 30 days supply | Qty: 30 | Fill #1

## 2018-01-27 ENCOUNTER — Other Ambulatory Visit: Payer: Self-pay

## 2018-01-28 ENCOUNTER — Other Ambulatory Visit: Payer: Self-pay

## 2018-01-29 ENCOUNTER — Other Ambulatory Visit: Payer: Self-pay

## 2018-01-29 DIAGNOSIS — B20 Human immunodeficiency virus [HIV] disease: Secondary | ICD-10-CM

## 2018-01-30 LAB — COMPLETE METABOLIC PANEL WITH GFR
AG Ratio: 1.3 (calc) (ref 1.0–2.5)
ALT: 19 U/L (ref 9–46)
AST: 18 U/L (ref 10–40)
Albumin: 4.1 g/dL (ref 3.6–5.1)
Alkaline phosphatase (APISO): 53 U/L (ref 40–115)
BUN/Creatinine Ratio: 7 (calc) (ref 6–22)
BUN: 11 mg/dL (ref 7–25)
CO2: 23 mmol/L (ref 20–32)
Calcium: 9.4 mg/dL (ref 8.6–10.3)
Chloride: 106 mmol/L (ref 98–110)
Creat: 1.48 mg/dL — ABNORMAL HIGH (ref 0.60–1.35)
GFR, Est African American: 68 mL/min/{1.73_m2} (ref >=60)
GFR, Est Non African American: 58 mL/min/{1.73_m2} — ABNORMAL LOW (ref >=60)
Globulin: 3.2 g/dL (calc) (ref 1.9–3.7)
Glucose, Bld: 134 mg/dL — ABNORMAL HIGH (ref 65–99)
Potassium: 4.2 mmol/L (ref 3.5–5.3)
Sodium: 139 mmol/L (ref 135–146)
Total Bilirubin: 0.6 mg/dL (ref 0.2–1.2)
Total Protein: 7.3 g/dL (ref 6.1–8.1)

## 2018-01-30 LAB — CBC WITH DIFFERENTIAL/PLATELET
BASOS ABS: 31 {cells}/uL (ref 0–200)
Basophils Relative: 1.1 %
EOS PCT: 3.9 %
Eosinophils Absolute: 109 cells/uL (ref 15–500)
HEMATOCRIT: 50.3 % — AB (ref 38.5–50.0)
HEMOGLOBIN: 17.2 g/dL — AB (ref 13.2–17.1)
LYMPHS ABS: 1064 {cells}/uL (ref 850–3900)
MCH: 28.7 pg (ref 27.0–33.0)
MCHC: 34.2 g/dL (ref 32.0–36.0)
MCV: 84 fL (ref 80.0–100.0)
MPV: 11.1 fL (ref 7.5–12.5)
Monocytes Relative: 9.3 %
NEUTROS ABS: 1336 {cells}/uL — AB (ref 1500–7800)
Neutrophils Relative %: 47.7 %
Platelets: 203 10*3/uL (ref 140–400)
RBC: 5.99 10*6/uL — AB (ref 4.20–5.80)
RDW: 18.3 % — ABNORMAL HIGH (ref 11.0–15.0)
Total Lymphocyte: 38 %
WBC: 2.8 10*3/uL — AB (ref 3.8–10.8)
WBCMIX: 260 {cells}/uL (ref 200–950)

## 2018-01-30 LAB — T-HELPER CELL (CD4) - (RCID CLINIC ONLY)
CD4 % Helper T Cell: 5 % — ABNORMAL LOW (ref 33–55)
CD4 T Cell Abs: 60 /uL — ABNORMAL LOW (ref 400–2700)

## 2018-01-30 LAB — RPR: RPR Ser Ql: NONREACTIVE

## 2018-02-02 LAB — HIV-1 RNA QUANT-NO REFLEX-BLD
HIV 1 RNA QUANT: 31 {copies}/mL — AB
HIV-1 RNA Quant, Log: 1.49 Log copies/mL — ABNORMAL HIGH

## 2018-02-11 ENCOUNTER — Encounter: Payer: Self-pay | Admitting: Infectious Disease

## 2018-02-11 ENCOUNTER — Ambulatory Visit (INDEPENDENT_AMBULATORY_CARE_PROVIDER_SITE_OTHER): Payer: Self-pay | Admitting: Infectious Disease

## 2018-02-11 VITALS — BP 137/97 | HR 78 | Temp 98.2°F | Ht 66.0 in | Wt 184.0 lb

## 2018-02-11 DIAGNOSIS — B20 Human immunodeficiency virus [HIV] disease: Secondary | ICD-10-CM

## 2018-02-11 DIAGNOSIS — Z23 Encounter for immunization: Secondary | ICD-10-CM

## 2018-02-11 DIAGNOSIS — I10 Essential (primary) hypertension: Secondary | ICD-10-CM

## 2018-02-11 MED ORDER — AZITHROMYCIN 600 MG PO TABS: 1200.0000 mg | ORAL_TABLET | ORAL | 5 refills | Status: DC

## 2018-02-11 MED ORDER — HYDROCHLOROTHIAZIDE 25 MG PO TABS: 25.0000 mg | ORAL_TABLET | Freq: Every day | ORAL | 11 refills | Status: DC

## 2018-02-11 NOTE — Addendum Note (Signed)
Addended by: Gildardo GriffesHILL, ASHLEY M on: 02/11/2018 09:30 AM   Modules accepted: Orders

## 2018-02-11 NOTE — Progress Notes (Signed)
Chief complaint:  followup for HIV disease  Subjective:    Patient ID: Ronald [name], male    DOB: 02-22-1976, 42 y.o.   MRN: [id number]  HPI   Ronald [name] is a 42 y.o. male with HIV infection who had been doing superbly well on their  antiviral regimen isentress and truvada, with undetectable viral load and health cd4 count when I last saw him FIVE years ago.  He had been living in [place] and at times was having his meds shipped to him and he did try also to stretch them out which I admonished him not to do. Ultimately he moved to [place] where he was in care and was on Tivicay and Descovy and well controlled. He had DVT and was on anticoagulation. [place] back [place] but did not re-establish care until this month and was without ARV for 8 months. When he came in for labs his VL was 45k range and CD4 had plummeted to 40. I was worried about him and he was brought in and seen by ID pharmacy who initiated BIKTARVY 30 day supply. He also applied for RW and HMAP and qualified for [facility] pt assistance program.  He has been on BIKTARVY along with Bactrim for PCP prevention. Viral load has been nicely suppressed but CD4 slow to come up.   Lab Results  Component Value Date   HIV1RNAQUANT 31 (H) 01/29/2018   HIV1RNAQUANT 79 (H) 11/24/2017   HIV1RNAQUANT 43,600 (H) 11/05/2017   Lab Results  Component Value Date   CD4TABS 60 (L) 01/29/2018   CD4TABS 60 (L) 11/24/2017   CD4TABS 40 (L) 11/05/2017   Past Medical History:  Diagnosis Date  . 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 HIV infection [facility])     Past Surgical History:  Procedure Laterality Date  . EXAMINATION UNDER ANESTHESIA  12/27/2011   Procedure: EXAM UNDER ANESTHESIA;  Surgeon: Shelly Rubenstein, MD;  Location: [facility];  Service: General;  Laterality: N/A;  . NO PAST SURGERIES    . WART FULGURATION  12/27/2011   Procedure: FULGURATION ANAL WART;  Surgeon: Shelly Rubenstein, MD;  Location: [facility];  Service: General;  Laterality: N/A;  Excision anal condyloma    Family History  Problem Relation Age of Onset  . Hypertension Mother   . Cancer Father   . Diabetes Maternal Aunt   . Seizures Paternal Aunt       Social History   Socioeconomic History  . Marital status: Single    Spouse name: Not on file  . Number of children: Not on file  . Years of education: Not on file  . Highest education level: Not on file  Occupational History  . Occupation: Emergency planning/management officer: Field seismologist  Social Needs  . Financial resource strain: Not on file  . Food insecurity:    Worry: Not on file    Inability: Not on file  . Transportation needs:    Medical: Not on file    Non-medical: Not on file  Tobacco Use  . Smoking status: Current Every Day Smoker    Years: 10.00    Types: Cigarettes  . Smokeless tobacco: Never Used  . Tobacco comment: 5-6 cig./day  Substance and Sexual Activity  . Alcohol use: Yes    Comment: occasionally  . Drug use: No    Frequency: 1.0 times per week  . Sexual activity: Not Currently    Comment: refused condoms  Lifestyle  .  Physical activity:    Days per week: Not on file    Minutes per session: Not on file  . Stress: Not on file  Relationships  . Social connections:    Talks on phone: Not on file    Gets together: Not on file    Attends religious service: Not on file    Active member of club or organization: Not on file    Attends meetings of clubs or organizations: Not on file    Relationship status: Not on file  Other Topics Concern  . Not on file  Social History Narrative   Lives with [facility]    Allergies  Allergen Reactions  . Ibuprofen Other (See Comments)    BLISTERS     Current Outpatient Medications:  .  bictegravir-emtricitabine-tenofovir AF (BIKTARVY) 50-200-25 MG TABS tablet, Take 1 tablet by mouth daily., Disp: 30 tablet, Rfl: 11 .  sulfamethoxazole-trimethoprim (BACTRIM) 400-80 MG tablet, Take 1  tablet by mouth daily., Disp: 30 tablet, Rfl: 1 .  sulfamethoxazole-trimethoprim (BACTRIM) 400-80 MG tablet, Take 1 tablet by mouth daily., Disp: 30 tablet, Rfl: 4     Review of Systems  Constitutional: Negative for activity change, appetite change, chills, diaphoresis, fatigue, fever and unexpected weight change.  HENT: Negative for congestion, rhinorrhea, sinus pressure, sneezing, sore throat and trouble swallowing.   Eyes: Negative for photophobia and visual disturbance.  Respiratory: Negative for cough, chest tightness, shortness of breath, wheezing and stridor.   Cardiovascular: Negative for chest pain, palpitations and leg swelling.  Gastrointestinal: Negative for abdominal distention, abdominal pain, anal bleeding, blood in stool, constipation, diarrhea, nausea and vomiting.  Genitourinary: Negative for difficulty urinating, dysuria, flank pain and hematuria.  Musculoskeletal: Negative for arthralgias, back pain, gait problem, joint swelling and myalgias.  Skin: Negative for color change, pallor, rash and wound.  Neurological: Negative for dizziness, tremors, weakness and light-headedness.  Hematological: Negative for adenopathy. Does not bruise/bleed easily.  Psychiatric/Behavioral: Negative for agitation, behavioral problems, confusion, decreased concentration, dysphoric mood and sleep disturbance.       Objective:   Physical Exam  Constitutional: He is oriented to person, place, and time. He appears well-developed and well-nourished. No distress.  HENT:  Head: Normocephalic and atraumatic.  Mouth/Throat: No oropharyngeal exudate.  Eyes: Conjunctivae and EOM are normal. No scleral icterus.  Neck: Normal range of motion. Neck supple.  Cardiovascular: Normal rate and regular rhythm.  Pulmonary/Chest: Effort normal. No respiratory distress. He has no wheezes.  Abdominal: He exhibits no distension.  Musculoskeletal: He exhibits no edema or tenderness.  Neurological: He is alert  and oriented to person, place, and time. He exhibits normal muscle tone. Coordination normal.  Skin: Skin is warm and dry. No rash noted. He is not diaphoretic. No erythema. No pallor.  Psychiatric: He has a normal mood and affect. His behavior is normal. Judgment and thought content normal.          Assessment & Plan:   HIV/AIDS: continue BIKTARVY. Marland Kitchen. Continue bactrim for PCP prevention. Add once weekly azithromcyin for MAI prevention for now  Renew HMAP next month  RTC in September  HTN: add HCTZ

## 2018-02-23 MED FILL — SULFAMETHOXAZOLE-TMP SS TAB: 400-80 | 30 days supply | Qty: 30 | Fill #0

## 2018-02-23 MED FILL — BIKTARVY 50-200-25 MG TABS: 50-200-25 | 30 days supply | Qty: 30 | Fill #0

## 2018-02-25 ENCOUNTER — Encounter: Payer: Self-pay | Admitting: Behavioral Health

## 2018-03-09 ENCOUNTER — Ambulatory Visit: Payer: Self-pay

## 2018-03-26 ENCOUNTER — Encounter: Payer: Self-pay | Admitting: Infectious Disease

## 2018-04-03 ENCOUNTER — Other Ambulatory Visit: Payer: Self-pay | Admitting: Infectious Disease

## 2018-04-03 ENCOUNTER — Other Ambulatory Visit: Payer: Self-pay | Admitting: Behavioral Health

## 2018-05-05 ENCOUNTER — Other Ambulatory Visit: Payer: Self-pay

## 2018-05-19 ENCOUNTER — Ambulatory Visit: Payer: Self-pay | Admitting: Infectious Disease

## 2018-07-15 ENCOUNTER — Other Ambulatory Visit: Payer: Self-pay

## 2018-07-15 DIAGNOSIS — B20 Human immunodeficiency virus [HIV] disease: Secondary | ICD-10-CM

## 2018-07-16 LAB — T-HELPER CELL (CD4) - (RCID CLINIC ONLY)
CD4 % Helper T Cell: 7 % — ABNORMAL LOW (ref 33–55)
CD4 T Cell Abs: 100 /uL — ABNORMAL LOW (ref 400–2700)

## 2018-07-17 LAB — LIPID PANEL
CHOLESTEROL: 180 mg/dL (ref <200)
HDL: 47 mg/dL (ref >40)
LDL CHOLESTEROL (CALC): 118 mg/dL — AB
Non-HDL Cholesterol (Calc): 133 mg/dL (calc) — ABNORMAL HIGH (ref <130)
Total CHOL/HDL Ratio: 3.8 (calc) (ref <5.0)
Triglycerides: 60 mg/dL (ref <150)

## 2018-07-17 LAB — CBC WITH DIFFERENTIAL/PLATELET
BASOS PCT: 1.6 %
Basophils Absolute: 50 cells/uL (ref 0–200)
Eosinophils Absolute: 171 cells/uL (ref 15–500)
Eosinophils Relative: 5.5 %
HCT: 53.3 % — ABNORMAL HIGH (ref 38.5–50.0)
HEMOGLOBIN: 18.4 g/dL — AB (ref 13.2–17.1)
Lymphs Abs: 1352 cells/uL (ref 850–3900)
MCH: 30.4 pg (ref 27.0–33.0)
MCHC: 34.5 g/dL (ref 32.0–36.0)
MCV: 88 fL (ref 80.0–100.0)
MONOS PCT: 11.7 %
MPV: 11.1 fL (ref 7.5–12.5)
NEUTROS ABS: 1166 {cells}/uL — AB (ref 1500–7800)
Neutrophils Relative %: 37.6 %
PLATELETS: 237 10*3/uL (ref 140–400)
RBC: 6.06 10*6/uL — AB (ref 4.20–5.80)
RDW: 17.4 % — ABNORMAL HIGH (ref 11.0–15.0)
TOTAL LYMPHOCYTE: 43.6 %
WBC mixed population: 363 cells/uL (ref 200–950)
WBC: 3.1 10*3/uL — AB (ref 3.8–10.8)

## 2018-07-17 LAB — COMPLETE METABOLIC PANEL WITH GFR
AG Ratio: 1.2 (calc) (ref 1.0–2.5)
ALT: 28 U/L (ref 9–46)
AST: 26 U/L (ref 10–40)
Albumin: 4.3 g/dL (ref 3.6–5.1)
Alkaline phosphatase (APISO): 63 U/L (ref 40–115)
BUN/Creatinine Ratio: 11 (calc) (ref 6–22)
BUN: 17 mg/dL (ref 7–25)
CALCIUM: 9.9 mg/dL (ref 8.6–10.3)
CHLORIDE: 102 mmol/L (ref 98–110)
CO2: 29 mmol/L (ref 20–32)
CREATININE: 1.59 mg/dL — AB (ref 0.60–1.35)
GFR, EST NON AFRICAN AMERICAN: 53 mL/min/{1.73_m2} — AB (ref >=60)
GFR, Est African American: 62 mL/min/{1.73_m2} (ref >=60)
GLUCOSE: 92 mg/dL (ref 65–99)
Globulin: 3.5 g/dL (calc) (ref 1.9–3.7)
Potassium: 4.3 mmol/L (ref 3.5–5.3)
Sodium: 138 mmol/L (ref 135–146)
TOTAL PROTEIN: 7.8 g/dL (ref 6.1–8.1)
Total Bilirubin: 0.6 mg/dL (ref 0.2–1.2)

## 2018-07-17 LAB — HIV-1 RNA QUANT-NO REFLEX-BLD
HIV 1 RNA QUANT: DETECTED {copies}/mL — AB
HIV-1 RNA Quant, Log: <1.3 Log copies/mL — AB

## 2018-07-17 LAB — RPR: RPR: NONREACTIVE

## 2018-07-21 ENCOUNTER — Telehealth: Payer: Self-pay | Admitting: *Deleted

## 2018-07-21 DIAGNOSIS — R7989 Other specified abnormal findings of blood chemistry: Secondary | ICD-10-CM

## 2018-07-21 NOTE — Telephone Encounter (Signed)
Left message asking patient to call back to speak with a nurse regarding test results and follow up. Patient scheduled to see Dr Daiva EvesVan Dam 12/2. Andree CossHowell, Elyn Krogh M, RN

## 2018-07-21 NOTE — Telephone Encounter (Signed)
-----   Message from Randall Hissornelius N Van Dam, MD sent at 07/20/2018  1:33 PM EST ----- Patient's renal function is worsening can we have him stop his diuretic and hydrate and come back for repeat labs later this week.  He just needs repeat BMP ----- Message ----- From: Interface, [facility] Lab Results In Sent: 07/15/2018   4:36 PM EST To: Randall Hissornelius N Van Dam, MD

## 2018-07-22 NOTE — Telephone Encounter (Signed)
Left message on patient's voicemail asking him to call back regarding lab results.

## 2018-07-23 NOTE — Telephone Encounter (Signed)
Spoke with patient, relayed Dr Zenaida NieceVan Dam's advice. He will hydrate (has not been drinking much at all due to increased work schedule).  He stop his hydrochlorothiazide. He will come Monday 11/25 for lab and blood pressure check. Andree CossHowell, Anubis Fundora M, RN

## 2018-07-23 NOTE — Telephone Encounter (Signed)
Thanks so much Michelle 

## 2018-07-27 ENCOUNTER — Other Ambulatory Visit: Payer: Self-pay

## 2018-07-27 ENCOUNTER — Ambulatory Visit: Payer: Self-pay | Admitting: *Deleted

## 2018-07-27 VITALS — BP 161/115 | HR 87

## 2018-07-27 DIAGNOSIS — I10 Essential (primary) hypertension: Secondary | ICD-10-CM

## 2018-07-27 DIAGNOSIS — R7989 Other specified abnormal findings of blood chemistry: Secondary | ICD-10-CM

## 2018-07-27 DIAGNOSIS — B20 Human immunodeficiency virus [HIV] disease: Secondary | ICD-10-CM

## 2018-07-27 NOTE — Addendum Note (Signed)
Addended by: Andree CossHOWELL, Jabar Krysiak M on: 07/27/2018 09:30 AM   Modules accepted: Orders

## 2018-07-28 ENCOUNTER — Telehealth: Payer: Self-pay | Admitting: *Deleted

## 2018-07-28 LAB — BASIC METABOLIC PANEL
BUN/Creatinine Ratio: 10 (calc) (ref 6–22)
BUN: 14 mg/dL (ref 7–25)
CALCIUM: 9.9 mg/dL (ref 8.6–10.3)
CO2: 27 mmol/L (ref 20–32)
CREATININE: 1.41 mg/dL — AB (ref 0.60–1.35)
Chloride: 102 mmol/L (ref 98–110)
GLUCOSE: 97 mg/dL (ref 65–99)
POTASSIUM: 4.1 mmol/L (ref 3.5–5.3)
Sodium: 136 mmol/L (ref 135–146)

## 2018-07-28 NOTE — Telephone Encounter (Signed)
Left message for the patient to call the office to have him restart his BP medication per Dr Daiva EvesVan Dam.

## 2018-07-28 NOTE — Telephone Encounter (Signed)
-----   Message from Randall Hissornelius N Van Dam, MD sent at 07/27/2018  5:09 PM EST ----- Regarding: RE: BP check Can he restart his meds for bp and have rechecked ----- Message ----- From: Lurlean LeydenPoole, Travis F, CMA Sent: 07/27/2018   3:12 PM EST To: Randall Hissornelius N Van Dam, MD Subject: BP check                                       Patient in today to have BP check and it was 161/115 pulse 87. He advised he has not taken medication for 5 days. He says he feels fine and has been urinating more since stopping. He also had urine test today. Please advise of next step.  Feliz Beamravis

## 2018-08-03 ENCOUNTER — Encounter: Payer: Self-pay | Admitting: Infectious Disease

## 2018-08-03 ENCOUNTER — Ambulatory Visit (INDEPENDENT_AMBULATORY_CARE_PROVIDER_SITE_OTHER): Payer: Self-pay | Admitting: Infectious Disease

## 2018-08-03 VITALS — BP 155/113 | HR 73 | Temp 98.0°F | Wt 185.0 lb

## 2018-08-03 DIAGNOSIS — B354 Tinea corporis: Secondary | ICD-10-CM

## 2018-08-03 DIAGNOSIS — N289 Disorder of kidney and ureter, unspecified: Secondary | ICD-10-CM

## 2018-08-03 DIAGNOSIS — R21 Rash and other nonspecific skin eruption: Secondary | ICD-10-CM

## 2018-08-03 DIAGNOSIS — B360 Pityriasis versicolor: Secondary | ICD-10-CM | POA: Insufficient documentation

## 2018-08-03 DIAGNOSIS — B20 Human immunodeficiency virus [HIV] disease: Secondary | ICD-10-CM

## 2018-08-03 HISTORY — DX: Pityriasis versicolor: B36.0

## 2018-08-03 HISTORY — DX: Rash and other nonspecific skin eruption: R21

## 2018-08-03 HISTORY — DX: Tinea corporis: B35.4

## 2018-08-03 MED ORDER — AMLODIPINE BESYLATE 5 MG PO TABS: 5.0000 mg | ORAL_TABLET | Freq: Every day | ORAL | 11 refills | Status: DC

## 2018-08-03 MED ORDER — FLUCONAZOLE 100 MG PO TABS: 100.0000 mg | ORAL_TABLET | Freq: Every day | ORAL | 0 refills | Status: DC

## 2018-08-03 MED ORDER — CLOTRIMAZOLE 1 % EX CREA: 1.0000 "application " | TOPICAL_CREAM | Freq: Two times a day (BID) | CUTANEOUS | 0 refills | Status: DC

## 2018-08-03 NOTE — Progress Notes (Signed)
Chief complaint:  followup for HIV disease, new diffuse hyperpigmented rash on chest and back  Subjective:    Patient ID: Ronald [name], male    DOB: 1976-02-06, 42 y.o.   MRN: [id number]  HPI   Ronald [name] is a 42 y.o. male with HIV infection who had been doing superbly well on their  antiviral regimen isentress and truvada, with undetectable viral load and health cd4 count when I last saw him FIVE years ago.  He had been living in [place] and at times was having his meds shipped to him and he did try also to stretch them out which I admonished him not to do. Ultimately he moved to [place] where he was in care and was on Tivicay and Descovy and well controlled. He had DVT and was on anticoagulation. [place] back [place] but did not re-establish care until this month and was without ARV for 8 months. When he came in for labs his VL was 45k range and CD4 had plummeted to 40. I was worried about him and he was brought in and seen by ID pharmacy who initiated BIKTARVY 30 day supply. He also applied for RW and HMAP and qualified for [facility] pt assistance program.  He has been on BIKTARVY along with Bactrim for PCP prevention. Viral load has been nicely suppressed but CD4 slow to come up.  Is doing very well with regards to that.  He does have hypertension and has been on hydrochlorothiazide.  On labs a couple weeks ago his creatinine had crept up and we asked him to stop his HCTZ and to hydrate.  He did stop temporarily but now is restarted yet again.  We will recheck his metabolic panel today.  Additionally has a new complaint which is diffuse hyperpigmented rash that is on his chest and also prominent on his back.  His RPR was negative when checked for syphilis recently.     Lab Results  Component Value Date   HIV1RNAQUANT <20 DETECTED (A) 07/15/2018   HIV1RNAQUANT 31 (H) 01/29/2018   HIV1RNAQUANT 79 (H) 11/24/2017   Lab Results  Component Value Date   CD4TABS 100 (L)  07/15/2018   CD4TABS 60 (L) 01/29/2018   CD4TABS 60 (L) 11/24/2017   Past Medical History:  Diagnosis Date  . 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 HIV infection [facility])     Past Surgical History:  Procedure Laterality Date  . EXAMINATION UNDER ANESTHESIA  12/27/2011   Procedure: EXAM UNDER ANESTHESIA;  Surgeon: Shelly Rubenstein, MD;  Location: [facility];  Service: General;  Laterality: N/A;  . NO PAST SURGERIES    . WART FULGURATION  12/27/2011   Procedure: FULGURATION ANAL WART;  Surgeon: Shelly Rubenstein, MD;  Location: [facility];  Service: General;  Laterality: N/A;  Excision anal condyloma    Family History  Problem Relation Age of Onset  . Hypertension Mother   . Cancer Father   . Diabetes Maternal Aunt   . Seizures Paternal Aunt       Social History   Socioeconomic History  . Marital status: Single    Spouse name: Not on file  . Number of children: Not on file  . Years of education: Not on file  . Highest education level: Not on file  Occupational History  . Occupation: Emergency planning/management officer: Field seismologist  Social Needs  . Financial resource strain: Not on file  . Food insecurity:  Worry: Not on file    Inability: Not on file  . Transportation needs:    Medical: Not on file    Non-medical: Not on file  Tobacco Use  . Smoking status: Former Smoker    Years: 10.00    Types: Cigarettes  . Smokeless tobacco: Never Used  . Tobacco comment: 5-6 cig./day  Substance and Sexual Activity  . Alcohol use: Yes    Comment: occasionally  . Drug use: No    Frequency: 1.0 times per week  . Sexual activity: Not Currently    Comment: refused condoms  Lifestyle  . Physical activity:    Days per week: Not on file    Minutes per session: Not on file  . Stress: Not on file  Relationships  . Social connections:    Talks on phone: Not on file    Gets together: Not on file    Attends religious service: Not on file    Active member of club  or organization: Not on file    Attends meetings of clubs or organizations: Not on file    Relationship status: Not on file  Other Topics Concern  . Not on file  Social History Narrative   Lives with [facility]    Allergies  Allergen Reactions  . Ibuprofen Other (See Comments)    BLISTERS     Current Outpatient Medications:  .  bictegravir-emtricitabine-tenofovir AF (BIKTARVY) 50-200-25 MG TABS tablet, Take 1 tablet by mouth daily., Disp: 30 tablet, Rfl: 11 .  hydrochlorothiazide (HYDRODIURIL) 25 MG tablet, Take 1 tablet (25 mg total) by mouth daily., Disp: 30 tablet, Rfl: 11 .  sulfamethoxazole-trimethoprim (BACTRIM,SEPTRA) 400-80 MG tablet, TAKE 1 TABLET BY MOUTH DAILY., Disp: 30 tablet, Rfl: 1 .  azithromycin (ZITHROMAX) 600 MG tablet, Take 2 tablets (1,200 mg total) by mouth once a week. (Patient not taking: Reported on 08/03/2018), Disp: 10 tablet, Rfl: 5     Review of Systems  Constitutional: Negative for activity change, appetite change, chills, diaphoresis, fatigue, fever and unexpected weight change.  HENT: Negative for congestion, rhinorrhea, sinus pressure, sneezing, sore throat and trouble swallowing.   Eyes: Negative for photophobia and visual disturbance.  Respiratory: Negative for cough, chest tightness, shortness of breath, wheezing and stridor.   Cardiovascular: Negative for chest pain, palpitations and leg swelling.  Gastrointestinal: Negative for abdominal distention, abdominal pain, anal bleeding, blood in stool, constipation, diarrhea, nausea and vomiting.  Genitourinary: Negative for difficulty urinating, dysuria, flank pain and hematuria.  Musculoskeletal: Negative for arthralgias, back pain, gait problem, joint swelling and myalgias.  Skin: Positive for rash. Negative for color change, pallor and wound.  Neurological: Negative for dizziness, tremors, weakness and light-headedness.  Hematological: Negative for adenopathy. Does not bruise/bleed easily.    Psychiatric/Behavioral: Negative for agitation, behavioral problems, confusion, decreased concentration, dysphoric mood and sleep disturbance.       Objective:   Physical Exam  Constitutional: He is oriented to person, place, and time. He appears well-developed and well-nourished. No distress.  HENT:  Head: Normocephalic and atraumatic.  Mouth/Throat: No oropharyngeal exudate.  Eyes: Conjunctivae and EOM are normal. No scleral icterus.  Neck: Normal range of motion. Neck supple.  Cardiovascular: Normal rate and regular rhythm.  Pulmonary/Chest: Effort normal. No respiratory distress. He has no wheezes.  Abdominal: He exhibits no distension.  Musculoskeletal: He exhibits no edema or tenderness.  Neurological: He is alert and oriented to person, place, and time. He exhibits normal muscle tone. Coordination normal.  Skin: Skin is warm and  dry. Rash noted. He is not diaphoretic. No erythema. No pallor.  Psychiatric: He has a normal mood and affect. His behavior is normal. Judgment and thought content normal.  Nursing note and vitals reviewed.   Rash is hyperpigmented looks almost like he has versicolor lower or potentially tinea corporis.  August 03, 2018:            Assessment & Plan:   HIV/AIDS: continue BIKTARVY. Marland Kitchen. Continue bactrim for PCP prevention. Reveiwed all labs and meaning of U=U in terms of prevention and health  Acute renal insufficiency: Recheck metabolic panel today if still elevated creatinine we will get rid of his HCTZ and switch over to amlodipine completely  HTN: Better blood pressure he has a blood pressure cuff at home.  If his metabolic panel is normal today he can continue his HCTZ but I am also going to add amlodipine 5 mg.  New Rash has mentioned this looks like tinea versicolor versus tinea corporis I will give him fluconazole 100 mg daily for a month and also give him topical clotrimazole.

## 2018-08-04 LAB — BASIC METABOLIC PANEL WITH GFR
BUN/Creatinine Ratio: 12 (calc) (ref 6–22)
BUN: 18 mg/dL (ref 7–25)
CO2: 28 mmol/L (ref 20–32)
Calcium: 9.8 mg/dL (ref 8.6–10.3)
Chloride: 100 mmol/L (ref 98–110)
Creat: 1.47 mg/dL — ABNORMAL HIGH (ref 0.60–1.35)
GFR, Est African American: 68 mL/min/{1.73_m2} (ref >=60)
GFR, Est Non African American: 58 mL/min/{1.73_m2} — ABNORMAL LOW (ref >=60)
Glucose, Bld: 94 mg/dL (ref 65–99)
Potassium: 3.7 mmol/L (ref 3.5–5.3)
Sodium: 138 mmol/L (ref 135–146)

## 2018-09-25 ENCOUNTER — Other Ambulatory Visit: Payer: Self-pay | Admitting: Infectious Disease

## 2018-09-28 ENCOUNTER — Other Ambulatory Visit: Payer: Self-pay | Admitting: Infectious Disease

## 2018-10-05 ENCOUNTER — Other Ambulatory Visit: Payer: Self-pay

## 2018-10-21 ENCOUNTER — Other Ambulatory Visit: Payer: Self-pay

## 2018-10-21 ENCOUNTER — Encounter: Payer: Self-pay | Admitting: Infectious Diseases

## 2018-10-26 ENCOUNTER — Other Ambulatory Visit: Payer: Self-pay

## 2018-11-05 ENCOUNTER — Encounter: Payer: Self-pay | Admitting: Infectious Disease

## 2018-11-05 ENCOUNTER — Ambulatory Visit: Payer: Self-pay

## 2018-11-05 NOTE — Progress Notes (Deleted)
Chief complaint:  followup for HIV disease Subjective:    Patient ID: Ronald [name], male    DOB: May 22, 1976, 43 y.o.   MRN: [id number]  HPI   Ronald [name] is a 43 y.o. male with HIV infection who had been doing superbly well on their  antiviral regimen isentress and truvada, with undetectable viral load and health cd4 count when I last saw him FIVE years ago.  He had been living in [place] and at times was having his meds shipped to him and he did try also to stretch them out which I admonished him not to do. Ultimately he moved to [place] where he was in care and was on Tivicay and Descovy and well controlled. He had DVT and was on anticoagulation. [place] did not re-establish care until this month and was without ARV for 8 months. When he came in for labs his VL was 45k range and CD4 had plummeted to 40. I was worried about him and he was brought in and seen by ID pharmacy who initiated BIKTARVY 30 day supply. He also applied for RW and HMAP and qualified for [facility] pt assistance program.  He has been on BIKTARVY along with Bactrim for PCP prevention. Viral load has been nicely suppressed but CD4 slow to come up.  Is doing very well with regards to that.  He does have hypertension and has been on hydrochlorothiazide.  On labs a couple weeks ago his creatinine had crept up and we asked him to stop his HCTZ and to hydrate.  He did stop temporarily but now is restarted yet again.  We will recheck his metabolic panel today.  Additionally has a new complaint which is diffuse hyperpigmented rash that is on his chest and also prominent on his back.  His RPR was negative when checked for syphilis recently.     Lab Results  Component Value Date   HIV1RNAQUANT <20 DETECTED (A) 07/15/2018   HIV1RNAQUANT 31 (H) 01/29/2018   HIV1RNAQUANT 79 (H) 11/24/2017   Lab Results  Component Value Date   CD4TABS 100 (L) 07/15/2018   CD4TABS 60 (L) 01/29/2018   CD4TABS 60 (L)  11/24/2017   Past Medical History:  Diagnosis Date  . 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 HIV infection (HCC)   . Rash 08/03/2018  . Tinea corporis 08/03/2018  . Tinea versicolor 08/03/2018    Past Surgical History:  Procedure Laterality Date  . EXAMINATION UNDER ANESTHESIA  12/27/2011   Procedure: EXAM UNDER ANESTHESIA;  Surgeon: Shelly Rubenstein, MD;  Location: [facility];  Service: General;  Laterality: N/A;  . NO PAST SURGERIES    . WART FULGURATION  12/27/2011   Procedure: FULGURATION ANAL WART;  Surgeon: Shelly Rubenstein, MD;  Location: [facility];  Service: General;  Laterality: N/A;  Excision anal condyloma    Family History  Problem Relation Age of Onset  . Hypertension Mother   . Cancer Father   . Diabetes Maternal Aunt   . Seizures Paternal Aunt       Social History   Socioeconomic History  . Marital status: Single    Spouse name: Not on file  . Number of children: Not on file  . Years of education: Not on file  . Highest education level: Not on file  Occupational History  . Occupation: Emergency planning/management officer: [employer]  Social Needs  . Financial resource strain: Not on file  .  Food insecurity:    Worry: Not on file    Inability: Not on file  . Transportation needs:    Medical: Not on file    Non-medical: Not on file  Tobacco Use  . Smoking status: Former Smoker    Years: 10.00    Types: Cigarettes  . Smokeless tobacco: Never Used  . Tobacco comment: 5-6 cig./day  Substance and Sexual Activity  . Alcohol use: Yes    Comment: occasionally  . Drug use: No    Frequency: 1.0 times per week  . Sexual activity: Not Currently    Comment: refused condoms  Lifestyle  . Physical activity:    Days per week: Not on file    Minutes per session: Not on file  . Stress: Not on file  Relationships  . Social connections:    Talks on phone: Not on file    Gets together: Not on file    Attends religious service: Not on file     Active member of club or organization: Not on file    Attends meetings of clubs or organizations: Not on file    Relationship status: Not on file  Other Topics Concern  . Not on file  Social History Narrative   Lives with [place]    Allergies  Allergen Reactions  . Ibuprofen Other (See Comments)    BLISTERS     Current Outpatient Medications:  .  amLODipine (NORVASC) 5 MG tablet, Take 1 tablet (5 mg total) by mouth daily., Disp: 30 tablet, Rfl: 11 .  azithromycin (ZITHROMAX) 600 MG tablet, Take 2 tablets (1,200 mg total) by mouth once a week. (Patient not taking: Reported on 08/03/2018), Disp: 10 tablet, Rfl: 5 .  bictegravir-emtricitabine-tenofovir AF (BIKTARVY) 50-200-25 MG TABS tablet, Take 1 tablet by mouth daily., Disp: 30 tablet, Rfl: 11 .  clotrimazole (LOTRIMIN) 1 % cream, Apply 1 application topically 2 (two) times daily., Disp: 30 g, Rfl: 0 .  fluconazole (DIFLUCAN) 100 MG tablet, Take 1 tablet (100 mg total) by mouth daily., Disp: 30 tablet, Rfl: 0 .  hydrochlorothiazide (HYDRODIURIL) 25 MG tablet, Take 1 tablet (25 mg total) by mouth daily., Disp: 30 tablet, Rfl: 11 .  sulfamethoxazole-trimethoprim (BACTRIM,SEPTRA) 400-80 MG tablet, TAKE 1 TABLET BY MOUTH EVERY DAY, Disp: 30 tablet, Rfl: 1     Review of Systems  Constitutional: Negative for activity change, appetite change, chills, diaphoresis, fatigue, fever and unexpected weight change.  HENT: Negative for congestion, rhinorrhea, sinus pressure, sneezing, sore throat and trouble swallowing.   Eyes: Negative for photophobia and visual disturbance.  Respiratory: Positive for apnea and shortness of breath. Negative for cough, chest tightness, wheezing and stridor.   Cardiovascular: Negative for chest pain, palpitations and leg swelling.  Gastrointestinal: Negative for abdominal distention, abdominal pain, anal bleeding, blood in stool, constipation, diarrhea, nausea and vomiting.  Genitourinary: Negative for difficulty  urinating, dysuria, flank pain and hematuria.  Musculoskeletal: Negative for arthralgias, back pain, gait problem, joint swelling and myalgias.  Skin: Positive for rash. Negative for color change, pallor and wound.  Allergic/Immunologic: Negative for environmental allergies.  Neurological: Negative for dizziness, tremors, weakness and light-headedness.  Hematological: Negative for adenopathy. Does not bruise/bleed easily.  Psychiatric/Behavioral: Negative for agitation, behavioral problems, confusion, decreased concentration, dysphoric mood and sleep disturbance.       Objective:   Physical Exam  Constitutional: He is oriented to person, place, and time. He appears well-developed and well-nourished. No distress.  HENT:  Head: Normocephalic and atraumatic.  Mouth/Throat: No  oropharyngeal exudate.  Eyes: Conjunctivae and EOM are normal. No scleral icterus.  Neck: Normal range of motion. Neck supple.  Cardiovascular: Normal rate and regular rhythm.  Pulmonary/Chest: Effort normal. No respiratory distress. He has no wheezes.  Abdominal: He exhibits no distension.  Musculoskeletal:        General: No tenderness or edema.  Neurological: He is alert and oriented to person, place, and time. He exhibits normal muscle tone. Coordination normal.  Skin: Skin is warm and dry. Rash noted. He is not diaphoretic. No erythema. No pallor.  Psychiatric: He has a normal mood and affect. His behavior is normal. Judgment and thought content normal.  Nursing note and vitals reviewed.   Rash is hyperpigmented looks almost like he has versicolor lower or potentially tinea corporis.  August 03, 2018:            Assessment & Plan:   HIV/AIDS: continue BIKTARVY. Marland Kitchen Continue bactrim for PCP prevention. Reveiwed all labs and meaning of U=U in terms of prevention and health  Acute renal insufficiency: Recheck metabolic panel today if still elevated creatinine we will get rid of his HCTZ and switch over  to amlodipine completely  HTN: Better blood pressure he has a blood pressure cuff at home.  If his metabolic panel is normal today he can continue his HCTZ but I am also going to add amlodipine 5 mg.  New Rash has mentioned this looks like tinea versicolor versus tinea corporis I will give him fluconazole 100 mg daily for a month and also give him topical clotrimazole.

## 2018-11-30 ENCOUNTER — Other Ambulatory Visit: Payer: Self-pay | Admitting: Infectious Disease

## 2018-12-25 ENCOUNTER — Other Ambulatory Visit: Payer: Self-pay | Admitting: Infectious Disease

## 2019-02-25 ENCOUNTER — Other Ambulatory Visit: Payer: Self-pay | Admitting: Infectious Disease

## 2019-03-01 ENCOUNTER — Telehealth: Payer: Self-pay | Admitting: *Deleted

## 2019-03-01 NOTE — Telephone Encounter (Signed)
Hi, Ronald [name]. I just tried to call you.  It looks like you're out of coverage for your medication, too.   Please call us today at [phone] to get an appointment for financial counseling (ADAP), labs, and follow up with DR [name]. Thank you,  Ronald [name] ===View-only below this line===   ----- Message -----      From:Ronald [name]      Sent:02/28/2019  1:08 AM EDT        CZ:[id number] [name], MD   Subject:Appointment Request  Appointment Request From: Ronald [name]  With Provider: Alcide Evener, MD [facility]]  Preferred Date Range: Any date 03/09/2019 or later  Preferred Times: Any Time  Reason for visit: Request an Appointment  Comments: I need to get labs done

## 2019-08-17 ENCOUNTER — Other Ambulatory Visit: Payer: BLUE CROSS/BLUE SHIELD

## 2019-08-17 ENCOUNTER — Other Ambulatory Visit: Payer: Self-pay

## 2019-08-17 ENCOUNTER — Telehealth: Payer: Self-pay

## 2019-08-17 DIAGNOSIS — B354 Tinea corporis: Secondary | ICD-10-CM

## 2019-08-17 DIAGNOSIS — B20 Human immunodeficiency virus [HIV] disease: Secondary | ICD-10-CM

## 2019-08-17 DIAGNOSIS — N289 Disorder of kidney and ureter, unspecified: Secondary | ICD-10-CM

## 2019-08-17 DIAGNOSIS — B360 Pityriasis versicolor: Secondary | ICD-10-CM

## 2019-08-17 DIAGNOSIS — R21 Rash and other nonspecific skin eruption: Secondary | ICD-10-CM

## 2019-08-17 NOTE — Addendum Note (Signed)
Addended byMeriel Pica F on: 08/17/2019 10:03 AM   Modules accepted: Orders

## 2019-08-17 NOTE — Telephone Encounter (Signed)
Patient called to make [facility] aware that due to his insurance he will need to use [facility]). Pharmacy added to profile. Per  [facility] pharmacy ([place] ) Patient has not filled meds since May 2020.  Unable to confirm this with patient, he did not answer phone.  When patient is seen in January he will need refills.  Ronald [name]

## 2019-08-18 ENCOUNTER — Telehealth: Payer: Self-pay | Admitting: *Deleted

## 2019-08-18 LAB — T-HELPER CELL (CD4) - (RCID CLINIC ONLY)
CD4 % Helper T Cell: 14 % — ABNORMAL LOW (ref 33–65)
CD4 T Cell Abs: 232 /uL — ABNORMAL LOW (ref 400–1790)

## 2019-08-18 NOTE — Telephone Encounter (Signed)
RN left message asking where to forward labs for primary care review, asked patient to call back with information. Landis Gandy, RN

## 2019-08-18 NOTE — Telephone Encounter (Signed)
-----   Message from Truman Hayward, MD sent at 08/18/2019 10:39 AM EST ----- His kidney function continues to worsen has he established with primary care? ----- Message ----- From: Interface, [facility] Lab Results In Sent: 08/17/2019   6:51 PM EST To: Truman Hayward, MD

## 2019-08-20 NOTE — Telephone Encounter (Signed)
RN again reached out to patient, left message on voicemail letting him know I was following up on labs, needed to know who he sees for primary care.  He is scheduled to see Dr [name] 09/06/19. Landis Gandy, RN

## 2019-08-28 LAB — COMPLETE METABOLIC PANEL WITH GFR
AG Ratio: 1.4 (calc) (ref 1.0–2.5)
ALT: 14 U/L (ref 9–46)
AST: 17 U/L (ref 10–40)
Albumin: 4.2 g/dL (ref 3.6–5.1)
Alkaline phosphatase (APISO): 65 U/L (ref 36–130)
BUN/Creatinine Ratio: 10 (calc) (ref 6–22)
BUN: 16 mg/dL (ref 7–25)
CO2: 30 mmol/L (ref 20–32)
Calcium: 9.5 mg/dL (ref 8.6–10.3)
Chloride: 104 mmol/L (ref 98–110)
Creat: 1.63 mg/dL — ABNORMAL HIGH (ref 0.60–1.35)
GFR, Est African American: 59 mL/min/{1.73_m2} — ABNORMAL LOW (ref >=60)
GFR, Est Non African American: 51 mL/min/{1.73_m2} — ABNORMAL LOW (ref >=60)
Globulin: 3.1 g/dL (calc) (ref 1.9–3.7)
Glucose, Bld: 93 mg/dL (ref 65–99)
Potassium: 4.2 mmol/L (ref 3.5–5.3)
Sodium: 140 mmol/L (ref 135–146)
Total Bilirubin: 0.4 mg/dL (ref 0.2–1.2)
Total Protein: 7.3 g/dL (ref 6.1–8.1)

## 2019-08-28 LAB — CBC WITH DIFFERENTIAL/PLATELET
Absolute Monocytes: 312 cells/uL (ref 200–950)
Basophils Absolute: 32 cells/uL (ref 0–200)
Basophils Relative: 0.8 %
Eosinophils Absolute: 272 cells/uL (ref 15–500)
Eosinophils Relative: 6.8 %
HCT: 50 % (ref 38.5–50.0)
Hemoglobin: 16.9 g/dL (ref 13.2–17.1)
Lymphs Abs: 1620 cells/uL (ref 850–3900)
MCH: 29.4 pg (ref 27.0–33.0)
MCHC: 33.8 g/dL (ref 32.0–36.0)
MCV: 87 fL (ref 80.0–100.0)
MPV: 11.1 fL (ref 7.5–12.5)
Monocytes Relative: 7.8 %
Neutro Abs: 1764 cells/uL (ref 1500–7800)
Neutrophils Relative %: 44.1 %
Platelets: 211 10*3/uL (ref 140–400)
RBC: 5.75 10*6/uL (ref 4.20–5.80)
RDW: 16.1 % — ABNORMAL HIGH (ref 11.0–15.0)
Total Lymphocyte: 40.5 %
WBC: 4 10*3/uL (ref 3.8–10.8)

## 2019-08-28 LAB — RPR: RPR Ser Ql: NONREACTIVE

## 2019-08-28 LAB — HIV-1 RNA QUANT-NO REFLEX-BLD
HIV 1 RNA Quant: <20 copies/mL
HIV-1 RNA Quant, Log: <1.3 Log copies/mL

## 2019-09-06 ENCOUNTER — Encounter: Payer: Self-pay | Admitting: Infectious Disease

## 2019-09-06 ENCOUNTER — Ambulatory Visit (INDEPENDENT_AMBULATORY_CARE_PROVIDER_SITE_OTHER): Payer: BLUE CROSS/BLUE SHIELD | Admitting: Infectious Disease

## 2019-09-06 ENCOUNTER — Other Ambulatory Visit: Payer: Self-pay

## 2019-09-06 VITALS — BP 161/125 | HR 93 | Temp 97.9°F | Ht 66.0 in | Wt 177.0 lb

## 2019-09-06 DIAGNOSIS — N183 Chronic kidney disease, stage 3 unspecified: Secondary | ICD-10-CM | POA: Diagnosis not present

## 2019-09-06 DIAGNOSIS — R631 Polydipsia: Secondary | ICD-10-CM

## 2019-09-06 DIAGNOSIS — I10 Essential (primary) hypertension: Secondary | ICD-10-CM | POA: Diagnosis not present

## 2019-09-06 DIAGNOSIS — B20 Human immunodeficiency virus [HIV] disease: Secondary | ICD-10-CM | POA: Diagnosis not present

## 2019-09-06 MED ORDER — HYDROCHLOROTHIAZIDE 25 MG PO TABS: ORAL_TABLET | ORAL | 11 refills | Status: DC

## 2019-09-06 MED ORDER — BIKTARVY 50-200-25 MG PO TABS: 1.0000 | ORAL_TABLET | Freq: Every day | ORAL | 11 refills | Status: DC

## 2019-09-06 MED ORDER — AMLODIPINE BESYLATE 10 MG PO TABS: 10.0000 mg | ORAL_TABLET | Freq: Every day | ORAL | 11 refills | Status: DC

## 2019-09-06 NOTE — Progress Notes (Signed)
Chief complaint:  followup for HIV disease  Subjective:    Patient ID: Ronald [name], male    DOB: [birth year], 44 y.o.   MRN: [id number]  HPI   Ronald [name] is a 44 y.o. male with HIV infection who had been doing superbly well on their  antiviral regimen isentress and truvada, with undetectable viral load and health cd4 count in past when I took care of him  Up until 2015.   He had been living in [place] and at times was having his meds shipped to him and he did try also to stretch them out which I warned  him not to do. Ultimately he moved to [place] where he was in care and was on Tivicay and Descovy and well controlled. He had DVT and was on anticoagulation. [place] back [place] but did not re-establish care until March of 2019 without ARV for 8 months. When he came in for labs his VL was 45k range and CD4 had plummeted to 40. I was worried about him and he was brought in and seen by ID pharmacy who initiated BIKTARVY 30 day supply. He also applied for RW and HMAP and qualified for [facility] pt assistance program.  He has been on BIKTARVY along with Bactrim for PCP prevention. Viral load has been nicely suppressed but CD4 slow to come up.  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 He has been highly adherent to his ARV regimen.  His BP however has not been well controlled and his renal function continues to worsen.  He was agreeable to flu vaccination here and he also has the opportunity for COVID 19 vaccine by his employer.  His rash appears to have responded  To topical triamicinolone.  Lab Results  Component Value Date   HIV1RNAQUANT <20 NOT DETECTED 08/17/2019   HIV1RNAQUANT <20 DETECTED (A) 07/15/2018   HIV1RNAQUANT 31 (H) 01/29/2018   Lab Results  Component Value Date   CD4TABS 232 (L) 08/17/2019   CD4TABS 100 (L) 07/15/2018   CD4TABS 60 (L) 01/29/2018   Past Medical History:  Diagnosis Date  . 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 HIV infection (HCC)   . Rash  08/03/2018  . Tinea corporis 08/03/2018  . Tinea versicolor 08/03/2018    Past Surgical History:  Procedure Laterality Date  . EXAMINATION UNDER ANESTHESIA  12/27/2011   Procedure: EXAM UNDER ANESTHESIA;  Surgeon: Shelly Rubenstein, MD;  Location: [facility];  Service: General;  Laterality: N/A;  . NO PAST SURGERIES    . WART FULGURATION  12/27/2011   Procedure: FULGURATION ANAL WART;  Surgeon: Shelly Rubenstein, MD;  Location: [facility];  Service: General;  Laterality: N/A;  Excision anal condyloma    Family History  Problem Relation Age of Onset  . Hypertension Mother   . Cancer Father   . Diabetes Maternal Aunt   . Seizures Paternal Aunt       Social History   Socioeconomic History  . Marital status: Single    Spouse name: Not on file  . Number of children: Not on file  . Years of education: Not on file  . Highest education level: Not on file  Occupational History  . Occupation: Emergency planning/management officer: [employer]  Tobacco Use  . Smoking status: Former Smoker    Years: 10.00    Types: Cigarettes  . Smokeless tobacco: Never Used  . Tobacco comment: 5-6 cig./day  Substance and Sexual  Activity  . Alcohol use: Yes    Comment: occasionally  . Drug use: No    Frequency: 1.0 times per week  . Sexual activity: Not Currently    Comment: refused condoms  Other Topics Concern  . Not on file  Social History Narrative   Lives with [facility]   Social Determinants of Health   Financial Resource Strain:   . Difficulty of Paying Living Expenses: Not on file  Food Insecurity:   . Worried About Charity fundraiser in the Last Year: Not on file  . Ran Out of Food in the Last Year: Not on file  Transportation Needs:   . Lack of Transportation (Medical): Not on file  . Lack of Transportation (Non-Medical): Not on file  Physical Activity:   . Days of Exercise per Week: Not on file  . Minutes of Exercise per Session: Not on file  Stress:    . Feeling of Stress : Not on file  Social Connections:   . Frequency of Communication with Friends and Family: Not on file  . Frequency of Social Gatherings with Friends and Family: Not on file  . Attends Religious Services: Not on file  . Active Member of Clubs or Organizations: Not on file  . Attends Archivist Meetings: Not on file  . Marital Status: Not on file    Allergies  Allergen Reactions  . Ibuprofen Other (See Comments)    BLISTERS     Current Outpatient Medications:  .  amLODipine (NORVASC) 5 MG tablet, Take 1 tablet (5 mg total) by mouth daily., Disp: 30 tablet, Rfl: 11 .  azithromycin (ZITHROMAX) 600 MG tablet, Take 2 tablets (1,200 mg total) by mouth once a week. (Patient not taking: Reported on 08/03/2018), Disp: 10 tablet, Rfl: 5 .  BIKTARVY 50-200-25 MG TABS tablet, TAKE 1 TABLET BY MOUTH DAILY, Disp: 30 tablet, Rfl: 5 .  clotrimazole (LOTRIMIN) 1 % cream, Apply 1 application topically 2 (two) times daily., Disp: 30 g, Rfl: 0 .  fluconazole (DIFLUCAN) 100 MG tablet, Take 1 tablet (100 mg total) by mouth daily., Disp: 30 tablet, Rfl: 0 .  hydrochlorothiazide (HYDRODIURIL) 25 MG tablet, TAKE 1 TABLET(25 MG) BY MOUTH DAILY, Disp: 30 tablet, Rfl: 0 .  sulfamethoxazole-trimethoprim (BACTRIM) 400-80 MG tablet, TAKE 1 TABLET BY MOUTH EVERY DAY, Disp: 30 tablet, Rfl: 1     Review of Systems  Constitutional: Negative for activity change, appetite change, chills, diaphoresis, fatigue, fever and unexpected weight change.  HENT: Negative for congestion, rhinorrhea, sinus pressure, sneezing, sore throat and trouble swallowing.   Eyes: Negative for photophobia and visual disturbance.  Respiratory: Negative for cough, chest tightness, shortness of breath, wheezing and stridor.   Cardiovascular: Negative for chest pain, palpitations and leg swelling.  Gastrointestinal: Negative for abdominal distention, abdominal pain, anal bleeding, blood in stool, constipation,  diarrhea, nausea and vomiting.  Endocrine: Positive for polydipsia and polyuria.  Genitourinary: Negative for difficulty urinating, dysuria, flank pain and hematuria.  Musculoskeletal: Negative for arthralgias, back pain, gait problem, joint swelling and myalgias.  Skin: Negative for color change, pallor, rash and wound.  Neurological: Negative for dizziness, tremors, weakness and light-headedness.  Hematological: Negative for adenopathy. Does not bruise/bleed easily.  Psychiatric/Behavioral: Negative for agitation, behavioral problems, confusion, decreased concentration, dysphoric mood and sleep disturbance.       Objective:   Physical Exam  Constitutional: He is oriented to person, place, and time. He appears well-developed and well-nourished. No distress.  HENT:  Head: Normocephalic  and atraumatic.  Mouth/Throat: No oropharyngeal exudate.  Eyes: Conjunctivae and EOM are normal. No scleral icterus.  Cardiovascular: Normal rate and regular rhythm.  Pulmonary/Chest: Effort normal. No respiratory distress. He has no wheezes.  Abdominal: He exhibits no distension.  Musculoskeletal:        General: No tenderness or edema.     Cervical back: Normal range of motion and neck supple.  Neurological: He is alert and oriented to person, place, and time. He exhibits normal muscle tone. Coordination normal.  Skin: Skin is warm and dry. No rash noted. He is not diaphoretic. No erythema. No pallor.  Psychiatric: He has a normal mood and affect. His behavior is normal. Judgment and thought content normal.  Nursing note and vitals reviewed.       Assessment & Plan:   HIV: continue BIKTARVY. OK to not be on PCP prophylaxis    HTN: needs to get better control over this or I  Worry about further CKD progression. He also needs to have PCP. I am adding thiazide back and increasing his amlodipine  CKD: as above, need to optimize BP control  Polydipsia and feeling thirsty: check A1c

## 2019-09-07 LAB — HEMOGLOBIN A1C
Hgb A1c MFr Bld: 4.5 % of total Hgb (ref <5.7)
Mean Plasma Glucose: 82 (calc)
eAG (mmol/L): 4.6 (calc)

## 2020-03-21 ENCOUNTER — Other Ambulatory Visit: Payer: BLUE CROSS/BLUE SHIELD

## 2020-04-05 ENCOUNTER — Encounter: Payer: BLUE CROSS/BLUE SHIELD | Admitting: Infectious Disease

## 2020-06-12 ENCOUNTER — Other Ambulatory Visit: Payer: BLUE CROSS/BLUE SHIELD

## 2020-06-26 ENCOUNTER — Encounter: Payer: BLUE CROSS/BLUE SHIELD | Admitting: Infectious Disease

## 2020-06-27 ENCOUNTER — Other Ambulatory Visit: Payer: BLUE CROSS/BLUE SHIELD

## 2020-07-10 ENCOUNTER — Ambulatory Visit: Payer: BLUE CROSS/BLUE SHIELD | Admitting: Infectious Disease

## 2020-07-18 ENCOUNTER — Other Ambulatory Visit: Payer: BLUE CROSS/BLUE SHIELD

## 2020-08-02 ENCOUNTER — Encounter: Payer: BLUE CROSS/BLUE SHIELD | Admitting: Infectious Disease

## 2020-10-03 ENCOUNTER — Other Ambulatory Visit: Payer: Self-pay | Admitting: Infectious Disease

## 2020-10-05 ENCOUNTER — Ambulatory Visit: Payer: BLUE CROSS/BLUE SHIELD | Admitting: Family

## 2020-10-24 ENCOUNTER — Encounter (HOSPITAL_BASED_OUTPATIENT_CLINIC_OR_DEPARTMENT_OTHER): Payer: Self-pay | Admitting: Emergency Medicine

## 2020-10-24 ENCOUNTER — Emergency Department (HOSPITAL_BASED_OUTPATIENT_CLINIC_OR_DEPARTMENT_OTHER)
Admission: EM | Admit: 2020-10-24 | Discharge: 2020-10-24 | Disposition: A | Discharge status: Home / Self Care | Payer: Worker's Compensation | Attending: Emergency Medicine | Admitting: Emergency Medicine

## 2020-10-24 ENCOUNTER — Other Ambulatory Visit: Payer: Self-pay

## 2020-10-24 DIAGNOSIS — X500XXA Overexertion from strenuous movement or load, initial encounter: Secondary | ICD-10-CM | POA: Diagnosis not present

## 2020-10-24 DIAGNOSIS — F17210 Nicotine dependence, cigarettes, uncomplicated: Secondary | ICD-10-CM | POA: Insufficient documentation

## 2020-10-24 DIAGNOSIS — I10 Essential (primary) hypertension: Secondary | ICD-10-CM | POA: Insufficient documentation

## 2020-10-24 DIAGNOSIS — S39012A Strain of muscle, fascia and tendon of lower back, initial encounter: Secondary | ICD-10-CM | POA: Diagnosis not present

## 2020-10-24 DIAGNOSIS — Z79899 Other long term (current) drug therapy: Secondary | ICD-10-CM | POA: Insufficient documentation

## 2020-10-24 DIAGNOSIS — B20 Human immunodeficiency virus [HIV] disease: Secondary | ICD-10-CM | POA: Insufficient documentation

## 2020-10-24 DIAGNOSIS — S3992XA Unspecified injury of lower back, initial encounter: Secondary | ICD-10-CM | POA: Diagnosis present

## 2020-10-24 NOTE — Discharge Instructions (Signed)
Use appeared to have suffered a lumbar muscle strain.  There are any signs of vertebral disc involvement or nerve impingement on your exam.  This will improve slowly over the next 2 to 4 weeks.  Do not do any heavy lifting for least the next week, use Tylenol as needed for pain relief.  Use heat such as a heating pad for pain relief.  You can also use topicals such as Lidoderm or Salonpas.  Please schedule an appointment with your primary care provider within the next week for follow-up.

## 2020-10-24 NOTE — ED Provider Notes (Signed)
[facility] EMERGENCY DEPARTMENT Provider Note   CSN: [id number] Arrival date & time: 10/24/20  4098     History Chief Complaint  Patient presents with  . Back Pain    Ronald [name] is a 45 y.o. male.  Works at [employer] at [employer] [facility].  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  He had immediate pain in lower back bilaterally that traveled down legs.  Now the pain is more left sided and no longer radiating past the hip. Earlier he had tingling sensation in his legs but not currently.  He has used tylenol, icyhot patch and biofreeze.  He cannot take ibuprofen because of an allergic skin reaction.  He has no hx of back pain or surgeries. No hx of iv drug use.  Diagnosed with HTN and HIV and compliant with his medications.             Past Medical History:  Diagnosis Date  . 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 HIV infection (HCC)   . Rash 08/03/2018  . Tinea corporis 08/03/2018  . Tinea versicolor 08/03/2018    Patient Active Problem List   Diagnosis Date Noted  . Tinea corporis 08/03/2018  . Rash 08/03/2018  . Tinea versicolor 08/03/2018  . Routine general medical examination at a health care facility 10/23/2011  . Blisters of multiple sites 04/09/2011  . Dermatitis, seborrheic 04/09/2011  . Condyloma acuminata 02/21/2011  . PERIPHERAL NEUROPATHY 08/29/2010  . AIDS (acquired immune deficiency syndrome) (HCC) 08/09/2010  . HIV disease (HCC) 07/12/2010  . CERVICAL LYMPHADENOPATHY, ANTERIOR, BILATERAL 06/04/2010  . Essential hypertension 01/26/2010  . LUNG NODULE 01/13/2008    Past Surgical History:  Procedure Laterality Date  . EXAMINATION UNDER ANESTHESIA  12/27/2011   Procedure: EXAM UNDER ANESTHESIA;  Surgeon: Shelly Rubenstein, MD;  Location: [facility];  Service: General;  Laterality: N/A;  . NO PAST SURGERIES    . WART FULGURATION   12/27/2011   Procedure: FULGURATION ANAL WART;  Surgeon: Shelly Rubenstein, MD;  Location: [facility];  Service: General;  Laterality: N/A;  Excision anal condyloma       Family History  Problem Relation Age of Onset  . Hypertension Mother   . Cancer Father   . Diabetes Maternal Aunt   . Seizures Paternal Aunt     Social History   Tobacco Use  . Smoking status: Current Every Day Smoker    Packs/day: 0.30    Years: 24.00    Pack years: 7.20    Types: Cigarettes  . Smokeless tobacco: Never Used  . Tobacco comment: 5-6 cig./day  Vaping Use  . Vaping Use: Never used  Substance Use Topics  . Alcohol use: Yes    Comment: occasionally  . Drug use: No    Frequency: 1.0 times per week    Home Medications Prior to Admission medications   Medication Sig Start Date End Date Taking? Authorizing Provider  amLODipine (NORVASC) 10 MG tablet Take 1 tablet (10 mg total) by mouth daily. 09/06/19   Randall Hiss, MD  BIKTARVY [phone] MG TABS tablet TAKE 1 TABLET BY MOUTH DAILY 10/03/20   Daiva Eves, Lisette Grinder, MD  hydrochlorothiazide (HYDRODIURIL) 25 MG tablet TAKE 1 TABLET(25 MG) BY MOUTH DAILY 09/06/19   Daiva Eves, Lisette Grinder, MD    Allergies    Ibuprofen  Review of Systems   Review of Systems  All other systems reviewed and are negative.   Physical Exam Updated Vital Signs BP (!) 150/115 (BP Location: Right Arm)   Pulse 83   Temp 98.3 F (36.8 C) (Oral)   Resp 18   Ht 5\' 7"  (1.702 m)   Wt 83.9 kg   SpO2 98%   BMI 28.98 kg/m   Physical Exam Vitals reviewed.  Constitutional:      Appearance: Normal appearance.  HENT:     Head: Normocephalic.  Eyes:     Extraocular Movements: Extraocular movements intact.     Conjunctiva/sclera: Conjunctivae normal.  Cardiovascular:     Rate and Rhythm: Normal rate and regular rhythm.     Heart sounds: No murmur heard.   Pulmonary:     Effort: Pulmonary effort is normal. No respiratory distress.     Breath  sounds: No wheezing.  Abdominal:     General: Abdomen is flat.  Musculoskeletal:     Comments: Mild Tender to palpation along the Lumbrosacral vertebrae.  Much greater tenderness along the right side of the L5-S1 region.  No TTP along the greater trochanter. Negative straight legged test.  No pain with internal or external rotation of the hips.   Neurological:     Mental Status: He is alert.     ED Results / Procedures / Treatments   Labs (all labs ordered are listed, but only abnormal results are displayed) Labs Reviewed - No data to display  EKG None  Radiology No results found.  Procedures Procedures   Medications Ordered in ED Medications - No data to display  ED Course  I have reviewed the triage vital signs and the nursing notes.  Pertinent labs & imaging results that were available during my care of the patient were reviewed by me and considered in my medical decision making (see chart for details).    MDM Rules/Calculators/A&P                          Patient presents today after suffering back pain while a patient he was lifting gave out in his arms.  Patient had immediate low back pain with radiation down the legs bilaterally.  Currently has left-sided lumbar back pain with no radiation and negative straight leg test.  Symptoms and exam consistent with lumbar muscle strain.  No signs of vertebral disc involvement or nerve impingement.  Advised patient to follow-up with his PCP, discussed rest, heat, Tylenol and topical pain relief medications.  Advised not to lift anything over 10 pounds for least 1 week.  Work note provided  Final Clinical Impression(s) / ED Diagnoses Final diagnoses:  Strain of lumbar region, initial encounter    Rx / DC Orders ED Discharge Orders    None       , MD 10/24/20 10/26/20    [id number], MD 10/24/20 1031

## 2020-10-24 NOTE — ED Triage Notes (Signed)
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  Having pain in lower back that radiates down his legs.

## 2020-11-02 ENCOUNTER — Other Ambulatory Visit: Payer: Self-pay | Admitting: Infectious Disease

## 2020-11-06 ENCOUNTER — Other Ambulatory Visit: Payer: Self-pay

## 2020-11-06 ENCOUNTER — Other Ambulatory Visit: Payer: BC Managed Care – PPO

## 2020-11-06 DIAGNOSIS — N183 Chronic kidney disease, stage 3 unspecified: Secondary | ICD-10-CM

## 2020-11-06 DIAGNOSIS — I10 Essential (primary) hypertension: Secondary | ICD-10-CM

## 2020-11-06 DIAGNOSIS — B20 Human immunodeficiency virus [HIV] disease: Secondary | ICD-10-CM

## 2020-11-06 DIAGNOSIS — R631 Polydipsia: Secondary | ICD-10-CM

## 2020-11-07 LAB — T-HELPER CELL (CD4) - (RCID CLINIC ONLY)
CD4 % Helper T Cell: 20 % — ABNORMAL LOW (ref 33–65)
CD4 T Cell Abs: 236 /uL — ABNORMAL LOW (ref 400–1790)

## 2020-11-08 LAB — COMPLETE METABOLIC PANEL WITH GFR
AG Ratio: 1.6 (calc) (ref 1.0–2.5)
ALT: 16 U/L (ref 9–46)
AST: 21 U/L (ref 10–40)
Albumin: 4.5 g/dL (ref 3.6–5.1)
Alkaline phosphatase (APISO): 76 U/L (ref 36–130)
BUN: 13 mg/dL (ref 7–25)
CO2: 29 mmol/L (ref 20–32)
Calcium: 9.7 mg/dL (ref 8.6–10.3)
Chloride: 102 mmol/L (ref 98–110)
Creat: 1.24 mg/dL (ref 0.60–1.35)
GFR, Est African American: 82 mL/min/{1.73_m2} (ref >=60)
GFR, Est Non African American: 71 mL/min/{1.73_m2} (ref >=60)
Globulin: 2.8 g/dL (calc) (ref 1.9–3.7)
Glucose, Bld: 91 mg/dL (ref 65–99)
Potassium: 4.3 mmol/L (ref 3.5–5.3)
Sodium: 137 mmol/L (ref 135–146)
Total Bilirubin: 0.6 mg/dL (ref 0.2–1.2)
Total Protein: 7.3 g/dL (ref 6.1–8.1)

## 2020-11-08 LAB — CBC WITH DIFFERENTIAL/PLATELET
Absolute Monocytes: 319 cells/uL (ref 200–950)
Basophils Absolute: 39 cells/uL (ref 0–200)
Basophils Relative: 1.1 %
Eosinophils Absolute: 161 cells/uL (ref 15–500)
Eosinophils Relative: 4.6 %
HCT: 50.4 % — ABNORMAL HIGH (ref 38.5–50.0)
Hemoglobin: 17.4 g/dL — ABNORMAL HIGH (ref 13.2–17.1)
Lymphs Abs: 1355 cells/uL (ref 850–3900)
MCH: 29.7 pg (ref 27.0–33.0)
MCHC: 34.5 g/dL (ref 32.0–36.0)
MCV: 86.2 fL (ref 80.0–100.0)
MPV: 11 fL (ref 7.5–12.5)
Monocytes Relative: 9.1 %
Neutro Abs: 1628 cells/uL (ref 1500–7800)
Neutrophils Relative %: 46.5 %
Platelets: 218 10*3/uL (ref 140–400)
RBC: 5.85 10*6/uL — ABNORMAL HIGH (ref 4.20–5.80)
RDW: 16.8 % — ABNORMAL HIGH (ref 11.0–15.0)
Total Lymphocyte: 38.7 %
WBC: 3.5 10*3/uL — ABNORMAL LOW (ref 3.8–10.8)

## 2020-11-08 LAB — LIPID PANEL
Cholesterol: 166 mg/dL (ref <200)
HDL: 47 mg/dL (ref >=40)
LDL Cholesterol (Calc): 106 mg/dL (calc) — ABNORMAL HIGH
Non-HDL Cholesterol (Calc): 119 mg/dL (calc) (ref <130)
Total CHOL/HDL Ratio: 3.5 (calc) (ref <5.0)
Triglycerides: 50 mg/dL (ref <150)

## 2020-11-08 LAB — RPR: RPR Ser Ql: NONREACTIVE

## 2020-11-08 LAB — HIV-1 RNA QUANT-NO REFLEX-BLD
HIV 1 RNA Quant: NOT DETECTED Copies/mL
HIV-1 RNA Quant, Log: NOT DETECTED Log cps/mL

## 2020-11-27 ENCOUNTER — Encounter: Payer: BLUE CROSS/BLUE SHIELD | Admitting: Infectious Disease

## 2020-12-04 ENCOUNTER — Other Ambulatory Visit: Payer: Self-pay | Admitting: Infectious Disease

## 2020-12-05 ENCOUNTER — Other Ambulatory Visit: Payer: Self-pay

## 2020-12-05 ENCOUNTER — Ambulatory Visit (INDEPENDENT_AMBULATORY_CARE_PROVIDER_SITE_OTHER): Payer: BC Managed Care – PPO | Admitting: Infectious Disease

## 2020-12-05 ENCOUNTER — Encounter: Payer: Self-pay | Admitting: Infectious Disease

## 2020-12-05 ENCOUNTER — Telehealth: Payer: Self-pay

## 2020-12-05 VITALS — BP 164/98 | HR 87 | Temp 98.3°F | Wt 182.0 lb

## 2020-12-05 DIAGNOSIS — B20 Human immunodeficiency virus [HIV] disease: Secondary | ICD-10-CM

## 2020-12-05 DIAGNOSIS — F4321 Adjustment disorder with depressed mood: Secondary | ICD-10-CM | POA: Diagnosis not present

## 2020-12-05 DIAGNOSIS — I10 Essential (primary) hypertension: Secondary | ICD-10-CM | POA: Diagnosis not present

## 2020-12-05 HISTORY — DX: Adjustment disorder with depressed mood: F43.21

## 2020-12-05 MED ORDER — BIKTARVY 50-200-25 MG PO TABS: 1.0000 | ORAL_TABLET | Freq: Every day | ORAL | 11 refills | Status: DC

## 2020-12-05 NOTE — Progress Notes (Signed)
Chief complaint:  followup for HIV disease  Subjective:    Patient ID: Ronald [name], male    DOB: Jan 21, 1976, 45 y.o.   MRN: [id number]  HPI   Ronald [name] is a 45 y.o. male with HIV infection who had been doing superbly well on their  antiviral regimen isentress and truvada, with undetectable viral load and health cd4 count in past when I took care of him  Up until 2015.   He had been living in [place] and at times was having his meds shipped to him and he did try also to stretch them out which I warned  him not to do. Ultimately he moved to [place] where he was in care and was on Tivicay and Descovy and well controlled. He had DVT and was on anticoagulation. [place] moved back to [place] but did not re-establish care until March of 2019 without ARV for 8 months. When he came in for labs his VL was 45k range and CD4 had plummeted to 40. I was worried about him and he was brought in and seen by [facility] pharmacy who initiated BIKTARVY 30 day supply. He also applied for RW and HMAP and qualified for [facility] pt assistance program.  He had been on BIKTARVY along with Bactrim for PCP prevention. Viral load has been nicely suppressed but CD4 slow to come up.  He last saw me last in January 2021.  He has however been taking his Biktarvy faithfully in the interim.  He is following with Henreitta Leber with family medicine with [facility].   He has undergone significant losses recently.  He lost his father who was in his 29s and apparently had a cancer that wrapped around his father's spinal cord.  He lost his uncle who had lupus and apparently a seizure disorder and got stuck in a snowstorm and died.  He also lost his aunt though he does not know the cause of her death.  The last death occurred in 11/03/2022.  He is now recovering from grieving the loss of his father and uncle and aunt.  He did not want to talk to a counselor and did not see the need for an SSRI.  He was relieved to hear that his  viral load remained suppressed and that his CD4 count is coming up.  Past Medical History:  Diagnosis Date  . 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 HIV infection (HCC)   . Rash 08/03/2018  . Tinea corporis 08/03/2018  . Tinea versicolor 08/03/2018    Past Surgical History:  Procedure Laterality Date  . EXAMINATION UNDER ANESTHESIA  12/27/2011   Procedure: EXAM UNDER ANESTHESIA;  Surgeon: Shelly Rubenstein, MD;  Location: [facility];  Service: General;  Laterality: N/A;  . NO PAST SURGERIES    . WART FULGURATION  12/27/2011   Procedure: FULGURATION ANAL WART;  Surgeon: Shelly Rubenstein, MD;  Location: [facility];  Service: General;  Laterality: N/A;  Excision anal condyloma    Family History  Problem Relation Age of Onset  . Hypertension Mother   . Cancer Father   . Diabetes Maternal Aunt   . Seizures Paternal Aunt       Social History   Socioeconomic History  . Marital status: Single    Spouse name: Not on file  . Number of children: Not on file  . Years of education: Not on file  . Highest education level: Not on file  Occupational History  . Occupation: Nursing  Assistant    Employer: [employer]  Tobacco Use  . Smoking status: Current Every Day Smoker    Packs/day: 0.30    Years: 24.00    Pack years: 7.20    Types: Cigarettes  . Smokeless tobacco: Never Used  . Tobacco comment: 5-6 cig./day  Vaping Use  . Vaping Use: Never used  Substance and Sexual Activity  . Alcohol use: Yes    Comment: occasionally  . Drug use: No    Frequency: 1.0 times per week  . Sexual activity: Not Currently    Comment: refused condoms  Other Topics Concern  . Not on file  Social History Narrative   Lives with [facility]   Social Determinants of Health   Financial Resource Strain: Not on file  Food Insecurity: Not on file  Transportation Needs: Not on file  Physical Activity: Not on file  Stress: Not on file  Social Connections: Not on file    Allergies   Allergen Reactions  . Ibuprofen Other (See Comments)    BLISTERS     Current Outpatient Medications:  .  amLODipine (NORVASC) 10 MG tablet, Take 1 tablet (10 mg total) by mouth daily., Disp: 30 tablet, Rfl: 11 .  BIKTARVY 50-200-25 MG TABS tablet, TAKE 1 TABLET BY MOUTH DAILY, Disp: 30 tablet, Rfl: 0 .  hydrochlorothiazide (HYDRODIURIL) 25 MG tablet, TAKE 1 TABLET(25 MG) BY MOUTH DAILY, Disp: 30 tablet, Rfl: 11     Review of Systems  Constitutional: Negative for activity change, appetite change, chills, diaphoresis, fatigue, fever and unexpected weight change.  HENT: Negative for congestion, rhinorrhea, sinus pressure, sneezing, sore throat and trouble swallowing.   Eyes: Negative for photophobia and visual disturbance.  Respiratory: Negative for cough, chest tightness, shortness of breath, wheezing and stridor.   Cardiovascular: Negative for chest pain, palpitations and leg swelling.  Gastrointestinal: Negative for abdominal distention, abdominal pain, anal bleeding, blood in stool, constipation, diarrhea, nausea and vomiting.  Endocrine: Negative for polydipsia.  Genitourinary: Negative for difficulty urinating, dysuria, flank pain and hematuria.  Musculoskeletal: Negative for arthralgias, back pain, gait problem, joint swelling and myalgias.  Skin: Negative for color change, pallor, rash and wound.  Neurological: Negative for dizziness, tremors, weakness and light-headedness.  Hematological: Negative for adenopathy. Does not bruise/bleed easily.  Psychiatric/Behavioral: Positive for dysphoric mood. Negative for agitation, behavioral problems, confusion, decreased concentration and sleep disturbance.       Objective:   Physical Exam Vitals and nursing note reviewed.  Constitutional:      General: He is not in acute distress.    Appearance: He is well-developed. He is not diaphoretic.  HENT:     Head: Normocephalic and atraumatic.     Mouth/Throat:     Pharynx: No  oropharyngeal exudate.  Eyes:     General: No scleral icterus.    Conjunctiva/sclera: Conjunctivae normal.  Cardiovascular:     Rate and Rhythm: Normal rate and regular rhythm.  Pulmonary:     Effort: Pulmonary effort is normal. No respiratory distress.     Breath sounds: No wheezing.  Abdominal:     General: There is no distension.  Musculoskeletal:        General: No tenderness.     Cervical back: Normal range of motion and neck supple.  Skin:    General: Skin is warm and dry.     Coloration: Skin is not pale.     Findings: No erythema or rash.  Neurological:     General: No focal deficit present.  Mental Status: He is alert and oriented to person, place, and time.     Motor: No abnormal muscle tone.     Coordination: Coordination normal.  Psychiatric:        Mood and Affect: Mood normal.        Behavior: Behavior normal.        Thought Content: Thought content normal.        Judgment: Judgment normal.         Assessment & Plan:   HIV: Continue Biktarvy return to clinic in 6 months time  HTN: BP not well controlled in OUR clnic. HOpefully better controlled at home. He has PCP following him  Vitals with BMI 12/05/2020 10/24/2020 09/06/2019  Height - 5\' 7"  5\' 6"   Weight 182 lbs 185 lbs 177 lbs  BMI - 28.97 28.58  Systolic 164 150  Diastolic 98 115 125  Pulse 87 83 93     CKD: creatinine improved BMP Latest Ref Rng & Units 11/06/2020 08/17/2019 08/03/2018  Glucose 65 - 99 mg/dL 91 93 94  BUN 7 - 25 mg/dL 13 16 18   Creatinine 0.60 - 1.35 mg/dL 08/19/2019 14/10/2017) )  BUN/Creat Ratio 6 - 22 (calc) NOT APPLICABLE 10 12  Sodium 135 - 146 mmol/L 137 140 138  Potassium 3.5 - 5.3 mmol/L 4.3 4.2 3.7  Chloride 98 - 110 mmol/L 102 104 100  CO2 20 - 32 mmol/L 29 30 28   Calcium 8.6 - 10.3 mg/dL 9.7 9.5 9.8   Grief: offered supportive counselling

## 2020-12-05 NOTE — Telephone Encounter (Signed)
Called patient at 8:50 regarding his 8:45 appointment with office. Attempted to offer patient video appointment if he is not able to come in the AM. Can reschedule for in person appointment if needed.  Left message requesting call back.

## 2020-12-11 ENCOUNTER — Other Ambulatory Visit: Payer: Self-pay

## 2020-12-11 MED ORDER — AMLODIPINE BESYLATE 10 MG PO TABS: 10.0000 mg | ORAL_TABLET | Freq: Every day | ORAL | 0 refills | Status: DC

## 2021-06-06 ENCOUNTER — Other Ambulatory Visit: Payer: BLUE CROSS/BLUE SHIELD

## 2021-06-20 ENCOUNTER — Encounter: Payer: BLUE CROSS/BLUE SHIELD | Admitting: Infectious Disease

## 2022-05-17 ENCOUNTER — Ambulatory Visit: Payer: Self-pay | Admitting: Family

## 2022-05-17 NOTE — Progress Notes (Deleted)
Brief Narrative   Patient ID: Ronald [name], male    DOB: 02-12-1976, 46 y.o.   MRN: [id number]  Mr. Hasegawa is a 46 y/o AA male diagnosed with HIV disease in October 2011 with risk factor of sexual contact. Initial viral load was 160,000 with CD4 count 50.  No Genosures are available for review. No history of opportunistic infection. Entered care at [facility] Stage 3.ART experience with Isentress, Truvada and Biktarvy.   Subjective:    No chief complaint on file.   HPI:  Ronald [name] is a 46 y.o. male with HIV disease last seen on 12/05/2020 by Dr. Daiva Eves for routine follow-up with good adherence and tolerance to [facility].  Viral load was undetectable with CD4 count of 236.  Kidney function, liver function, electrolytes within normal ranges.  Lipid profile with triglycerides 50, HDL 47, and LDL 106.  RPR was nonreactive for syphilis.  Here today for follow-up.   Allergies  Allergen Reactions   Ibuprofen Other (See Comments)    BLISTERS      Outpatient Medications Prior to Visit  Medication Sig Dispense Refill   amLODipine (NORVASC) 10 MG tablet Take 1 tablet (10 mg total) by mouth daily. 30 tablet 0   bictegravir-emtricitabine-tenofovir AF (BIKTARVY) 50-200-25 MG TABS tablet Take 1 tablet by mouth daily. 30 tablet 11   hydrochlorothiazide (HYDRODIURIL) 25 MG tablet TAKE 1 TABLET(25 MG) BY MOUTH DAILY 30 tablet 11   No facility-administered medications prior to visit.     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   Grieving 12/05/2020   HIV infection (HCC)    Rash 08/03/2018   Tinea corporis 08/03/2018   Tinea versicolor 08/03/2018     Past Surgical History:  Procedure Laterality Date   EXAMINATION UNDER ANESTHESIA  12/27/2011   Procedure: EXAM UNDER ANESTHESIA;  Surgeon: Shelly Rubenstein, MD;  Location: [facility];  Service: General;  Laterality: N/A;   NO PAST SURGERIES     WART FULGURATION  12/27/2011   Procedure: FULGURATION ANAL WART;   Surgeon: Shelly Rubenstein, MD;  Location: [facility];  Service: General;  Laterality: N/A;  Excision anal condyloma      Review of Systems  Constitutional:  Negative for appetite change, chills, fatigue, fever and unexpected weight change.  Eyes:  Negative for visual disturbance.  Respiratory:  Negative for cough, chest tightness, shortness of breath and wheezing.   Cardiovascular:  Negative for chest pain and leg swelling.  Gastrointestinal:  Negative for abdominal pain, constipation, diarrhea, nausea and vomiting.  Genitourinary:  Negative for dysuria, flank pain, frequency, genital sores, hematuria and urgency.  Skin:  Negative for rash.  Allergic/Immunologic: Negative for immunocompromised state.  Neurological:  Negative for dizziness and headaches.      Objective:    There were no vitals taken for this visit. Nursing note and vital signs reviewed.  Physical Exam Constitutional:      General: He is not in acute distress.    Appearance: He is well-developed.  Eyes:     Conjunctiva/sclera: Conjunctivae normal.  Cardiovascular:     Rate and Rhythm: Normal rate and regular rhythm.     Heart sounds: Normal heart sounds. No murmur heard.    No friction rub. No gallop.  Pulmonary:     Effort: Pulmonary effort is normal. No respiratory distress.     Breath sounds: Normal breath sounds. No wheezing or rales.  Chest:     Chest wall:  No tenderness.  Abdominal:     General: Bowel sounds are normal.     Palpations: Abdomen is soft.     Tenderness: There is no abdominal tenderness.  Musculoskeletal:     Cervical back: Neck supple.  Lymphadenopathy:     Cervical: No cervical adenopathy.  Skin:    General: Skin is warm and dry.     Findings: No rash.  Neurological:     Mental Status: He is alert and oriented to person, place, and time.  Psychiatric:        Behavior: Behavior normal.        Thought Content: Thought content normal.        Judgment: Judgment  normal.         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   No orders of the defined types were placed in this encounter.    Follow-up: No follow-ups on file.   Marcos Eke, MSN, FNP-C Nurse Practitioner [facility] Main number: [phone]

## 2022-06-06 ENCOUNTER — Encounter: Payer: Self-pay | Admitting: Orthopedic Surgery

## 2022-06-06 ENCOUNTER — Ambulatory Visit: Payer: 59 | Admitting: Orthopedic Surgery

## 2022-06-06 VITALS — BP 158/92 | HR 83 | Temp 98.1°F | Resp 17 | Ht 67.0 in | Wt 182.8 lb

## 2022-06-06 DIAGNOSIS — Z6828 Body mass index (BMI) 28.0-28.9, adult: Secondary | ICD-10-CM

## 2022-06-06 DIAGNOSIS — I10 Essential (primary) hypertension: Secondary | ICD-10-CM

## 2022-06-06 DIAGNOSIS — Z23 Encounter for immunization: Secondary | ICD-10-CM | POA: Diagnosis not present

## 2022-06-06 DIAGNOSIS — F17210 Nicotine dependence, cigarettes, uncomplicated: Secondary | ICD-10-CM

## 2022-06-06 DIAGNOSIS — R911 Solitary pulmonary nodule: Secondary | ICD-10-CM

## 2022-06-06 DIAGNOSIS — E7800 Pure hypercholesterolemia, unspecified: Secondary | ICD-10-CM

## 2022-06-06 DIAGNOSIS — B20 Human immunodeficiency virus [HIV] disease: Secondary | ICD-10-CM

## 2022-06-06 MED ORDER — HYDROCHLOROTHIAZIDE 25 MG PO TABS: ORAL_TABLET | ORAL | 1 refills | Status: DC

## 2022-06-06 MED ORDER — AMLODIPINE BESYLATE 10 MG PO TABS: 10.0000 mg | ORAL_TABLET | Freq: Every day | ORAL | 1 refills | Status: DC

## 2022-06-06 NOTE — Patient Instructions (Addendum)
Consume < 2000 mg of sodium daily  Contact PCP if you have > blood pressure > 180/90, headaches, blurred vision  Please take blood pressure twice daily for one week prior to next visit- please bring next visit  Please schedule follow up with Dr. [name]

## 2022-06-06 NOTE — Progress Notes (Signed)
Careteam: Patient Care Team: Yvonna Alanis, NP as PCP - General (Adult Health Nurse Practitioner) [name], Lavell Islam, MD as PCP - Infectious Diseases (Infectious Diseases)  Seen by: Windell Moulding, AGNP-C  PLACE OF SERVICE:  [place] Directive information Does Patient Have a Medical Advance Directive?: No, Would patient like information on creating a medical advance directive?: No - Patient declined  Allergies  Allergen Reactions   Ibuprofen Other (See Comments)    BLISTERS    Chief Complaint  Patient presents with   New Patient (Initial Visit)    Patient is establishing care.   Immunizations    Discussed the need for Covid, Tetanus, and flu Vaccine [facility] verified     HPI: Patient is a 46 y.o. male seen today to establish at [facility].   Previous PCP was with [facility]. Did not care for office. Originally from [place]. 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 No children. Likes comedies.   HIV- diagnosed in late 20's, partner at the time was cheating on him and passed virus to him, he is followed by Dr. [name] but has not seen him in 1 year, Phillips Odor has not been refilled in 1 year- he reports using his husbands   HTN- BUN/creat 13/1.24 11/06/2020, diagnosed in his 70's, he has not taken his Norvasc or HCTZ in weeks- ran out of medication, BP elevated in office today, denies chest pain, sob, blurred vision, headaches.   HLD- LDL 106 10/2020  Denies MI, diabetes or cancers.   Past surgeries: Anal wart removal 2013  No recent hospitalizations or injuries.   Family history: Mother age 19- H/O breast cancer Father deceased at age 24, MI. H/o HTN Sister age 18- HTN, renal disease/ dialysis Maternal grandmother- HTN  Sexual history: prefers men, monogamous relationship with husband, 4 partners in past  Smoking: began smoking at age 76, < 1/2 PPD, quit for 3 years in past, no plans to quit Alcohol: prefers beer, drinks about 6 per week, no past  use or abuse Drugs: no past use or abuse  Diet: eats 2 meals daily, eating about 2-3 servings daily, drinks 1-2 sodas daily Exercise: no regular exercise plan   Does not have living will.    Review of Systems:  Review of Systems  Constitutional:  Negative for malaise/fatigue and weight loss.  HENT:  Negative for hearing loss and sore throat.   Eyes:  Negative for blurred vision and double vision.  Respiratory:  Negative for cough, shortness of breath and wheezing.   Cardiovascular:  Negative for chest pain and leg swelling.  Gastrointestinal:  Negative for abdominal pain, blood in stool, constipation, diarrhea, heartburn, nausea and vomiting.  Genitourinary:  Negative for dysuria and hematuria.  Musculoskeletal:  Negative for falls and joint pain.  Skin:  Negative for rash.  Neurological:  Negative for dizziness, weakness and headaches.  Endo/Heme/Allergies:  Positive for environmental allergies.  Psychiatric/Behavioral:  Negative for depression and substance abuse. The patient is not nervous/anxious and does not have insomnia.     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   Grieving 12/05/2020   HIV infection ([place])    Rash 08/03/2018   Tinea corporis 08/03/2018   Tinea versicolor 08/03/2018   Past Surgical History:  Procedure Laterality Date   EXAMINATION UNDER ANESTHESIA  12/27/2011   Procedure: EXAM UNDER ANESTHESIA;  Surgeon: Harl Bowie, MD;  Location: [place];  Service: General;  Laterality:  N/A;   NO PAST SURGERIES     WART FULGURATION  12/27/2011   Procedure: FULGURATION ANAL WART;  Surgeon: Harl Bowie, MD;  Location: [place];  Service: General;  Laterality: N/A;  Excision anal condyloma   Social History:   reports that he has been smoking cigarettes. He has a 7.20 pack-year smoking history. He has never used smokeless tobacco. He reports current alcohol use. He reports that he does not use  drugs.  Family History  Problem Relation Age of Onset   Hypertension Mother    Cancer Father    Diabetes Maternal Aunt    Seizures Paternal Aunt     Medications: Patient's Medications  New Prescriptions   No medications on file  Previous Medications   AMLODIPINE (NORVASC) 10 MG TABLET    Take 1 tablet (10 mg total) by mouth daily.   BICTEGRAVIR-EMTRICITABINE-TENOFOVIR AF (BIKTARVY) 50-200-25 MG TABS TABLET    Take 1 tablet by mouth daily.   HYDROCHLOROTHIAZIDE (HYDRODIURIL) 25 MG TABLET    TAKE 1 TABLET(25 MG) BY MOUTH DAILY  Modified Medications   No medications on file  Discontinued Medications   No medications on file    Physical Exam:  Vitals:   06/06/22 0912 06/06/22 0915  BP: (!) 162/102 (!) 164/98  Pulse: 83   Resp: 17   Temp: 98.1 F (36.7 C)   TempSrc: Temporal   SpO2: 97%   Weight: 182 lb 12.8 oz (82.9 kg)   Height: '5\' 7"'  (1.702 m)    Body mass index is 28.63 kg/m. Wt Readings from Last 3 Encounters:  06/06/22 182 lb 12.8 oz (82.9 kg)  12/05/20 182 lb (82.6 kg)  10/24/20 185 lb (83.9 kg)    Physical Exam Vitals reviewed.  Constitutional:      General: He is not in acute distress. HENT:     Head: Normocephalic.  Eyes:     General:        Right eye: No discharge.        Left eye: No discharge.  Neck:     Vascular: No carotid bruit.  Cardiovascular:     Rate and Rhythm: Normal rate and regular rhythm.     Pulses: Normal pulses.     Heart sounds: Normal heart sounds.  Pulmonary:     Effort: Pulmonary effort is normal. No respiratory distress.     Breath sounds: Normal breath sounds. No wheezing.  Abdominal:     General: Abdomen is flat. Bowel sounds are normal. There is no distension.     Palpations: Abdomen is soft.     Tenderness: There is no abdominal tenderness.  Musculoskeletal:     Cervical back: Neck supple.     Right lower leg: No edema.     Left lower leg: No edema.  Lymphadenopathy:     Cervical: No cervical adenopathy.   Skin:    General: Skin is warm and dry.     Capillary Refill: Capillary refill takes less than 2 seconds.  Neurological:     General: No focal deficit present.     Mental Status: He is alert and oriented to person, place, and time.     Motor: No weakness.     Gait: Gait normal.  Psychiatric:        Mood and Affect: Mood normal.        Behavior: Behavior normal.     Labs reviewed: Basic Metabolic Panel: No results for input(s): "NA", "K", "CL", "CO2", "GLUCOSE", "BUN", "  CREATININE", "CALCIUM", "MG", "PHOS", "TSH" in the last 8760 hours. Liver Function Tests: No results for input(s): "AST", "ALT", "ALKPHOS", "BILITOT", "PROT", "ALBUMIN" in the last 8760 hours. No results for input(s): "LIPASE", "AMYLASE" in the last 8760 hours. No results for input(s): "AMMONIA" in the last 8760 hours. CBC: No results for input(s): "WBC", "NEUTROABS", "HGB", "HCT", "MCV", "PLT" in the last 8760 hours. Lipid Panel: No results for input(s): "CHOL", "HDL", "LDLCALC", "TRIG", "CHOLHDL", "LDLDIRECT" in the last 8760 hours. TSH: No results for input(s): "TSH" in the last 8760 hours. A1C: Lab Results  Component Value Date   HGBA1C 4.5 09/06/2019     Assessment/Plan 1. Essential hypertension - uncontrolled, goal < 120/80 - was out of medication for awhile - restart amlodipine and HCTZ - recommend < 2000 mg sodium in diet - take blood pressure 2x/day x 1 week- bring readings next appointment - amLODipine (NORVASC) 10 MG tablet; Take 1 tablet (10 mg total) by mouth daily.  Dispense: 30 tablet; Refill: 1 - hydrochlorothiazide (HYDRODIURIL) 25 MG tablet; TAKE 1 TABLET(25 MG) BY MOUTH DAILY  Dispense: 30 tablet; Refill: 1 - CBC with Differential/Platelet; Future - CMP with eGFR([facility])  2. Hypercholesteremia - LDL 106 10/2020 - Lipid Panel  3. HIV disease ([place]) - not seen ID in over 1 year- scheduled 10/06 - reports taking husbands Biktarvy  4. Flu vaccine need - Flu Vaccine QUAD High  Dose(Fluad)  5. Smoking 1/2 pack a day or less - began smoking in 30's - no plans to quit at this time  6. BMI 28.0-28.9,adult - cont to limit calories  - recommend adding 150 min/week of exercise  7. Lung nodule - 12/2009 CT noted stable pleural based nodules in both lungs- no follow up indicated - 2014 CXR unremarkable  Future labs/tests: discuss colonoscopy and TDAP  Total time: 45 minutes. Greater than 50% of total time spent doing patient education regarding HTN, HLD, HIV, and health maintenance.    Next appt: 07/04/2022  Windell Moulding, [facility] [phone]

## 2022-06-06 NOTE — Progress Notes (Signed)
Brief Narrative   Patient ID: Ronald [name], male    DOB: [birth year], 46 y.o.   MRN: [id number]  Mr. Ronald [name] is a 46 y/o AA male diagnosed with HIV disease in 2011 with risk factor of MSM. Initial lab work on 07/13/10 with viral load of 164,000 and CD4 count 50. Genosure was unable to be completed. [id number] negative. No history of opportunistic infection. Entered care at [facility] Stage 3. ART experience with raltegravir, emtricitibine/TDF, and Biktarvy.   Subjective:    Chief Complaint  Patient presents with   Follow-up    HPI:  Ronald [name] is a 46 y.o. male with AIDS/HIV last seen by Dr. Daiva Eves on 12/05/20 with well controlled virus and good adherence and tolerance to Biktarvy. Viral load was undetectable and CD4 count 236. Kidney function, liver function and electrolytes within normal ranges. Lipid profile withe LDL 106, HDL 47 and triglycerides 50. RPR was non-reactive for syphilis. Blood pressure was poorly controlled at the time. Seen in Internal Medicine on 06/06/22 and was restarted on amlodipine and hydrochlorothiazide. Here today for follow up.   Ronald [name] has been doing okay since his last office visit. Has not been able to follow up due to having sick family members and lost his insurance at one point. Has been taking his husband's Biktarvy who had multiple extra bottles of medication. No adverse side effects and now with new insurance no problems obtaining medication from the pharmacy. Feeling well today and working on getting his blood pressure controlled. Condoms and STD testing offered. Healthcare maintenance due includes colonoscopy for colon cancer screening and routine dental care. Vaccines are up to date.   Denies fevers, chills, night sweats, headaches, changes in vision, neck pain/stiffness, nausea, diarrhea, vomiting, lesions or rashes.   Allergies  Allergen Reactions   Ibuprofen Other (See Comments)    BLISTERS      Outpatient Medications Prior to Visit  Medication  Sig Dispense Refill   amLODipine (NORVASC) 10 MG tablet Take 1 tablet (10 mg total) by mouth daily. 30 tablet 1   hydrochlorothiazide (HYDRODIURIL) 25 MG tablet TAKE 1 TABLET(25 MG) BY MOUTH DAILY 30 tablet 1   bictegravir-emtricitabine-tenofovir AF (BIKTARVY) 50-200-25 MG TABS tablet Take 1 tablet by mouth daily. (Patient not taking: Reported on 06/07/2022) 30 tablet 11   No facility-administered medications prior to visit.     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   Grieving 12/05/2020   HIV infection ([facility])    Rash 08/03/2018   Tinea corporis 08/03/2018   Tinea versicolor 08/03/2018     Past Surgical History:  Procedure Laterality Date   EXAMINATION UNDER ANESTHESIA  12/27/2011   Procedure: EXAM UNDER ANESTHESIA;  Surgeon: Shelly Rubenstein, MD;  Location:  SUR[facility]ERY CENTER;  Service: General;  Laterality: N/A;   NO PAST SURGERIES     WART FULGURATION  12/27/2011   Procedure: FULGURATION ANAL WART;  Surgeon: Shelly Rubenstein, MD;  Location:  SURGERY CENTER;  Service: General;  Laterality: N/A;  Excision anal condyloma      Review of Systems  Constitutional:  Negative for appetite change, chills, fatigue, fever and unexpected weight change.  Eyes:  Negative for visual disturbance.  Respiratory:  Negative for cough, chest tightness, shortness of breath and wheezing.   Cardiovascular:  Negative for chest pain and leg swelling.  Gastrointestinal:  Negative for abdominal pain, constipation, diarrhea, nausea and vomiting.  Genitourinary:  Negative for dysuria, flank pain, frequency, genital sores,  hematuria and urgency.  Skin:  Negative for rash.  Allergic/Immunologic: Negative for immunocompromised state.  Neurological:  Negative for dizziness and headaches.      Objective:    BP (!) 155/116 Comment: Provider notified  Pulse (!) 112 Comment: Smoked this morning  Temp 98.4 F (36.9 C) (Oral)   Ht 5\' 6"  (1.676 m)   Wt 181 lb (82.1 kg)    SpO2 96%   BMI 29.21 kg/m  Nursing note and vital signs reviewed.  Physical Exam Constitutional:      General: He is not in acute distress.    Appearance: He is well-developed.  Eyes:     Conjunctiva/sclera: Conjunctivae normal.  Cardiovascular:     Rate and Rhythm: Normal rate and regular rhythm.     Heart sounds: Normal heart sounds. No murmur heard.    No friction rub. No gallop.  Pulmonary:     Effort: Pulmonary effort is normal. No respiratory distress.     Breath sounds: Normal breath sounds. No wheezing or rales.  Chest:     Chest wall: No tenderness.  Abdominal:     General: Bowel sounds are normal.     Palpations: Abdomen is soft.     Tenderness: There is no abdominal tenderness.  Musculoskeletal:     Cervical back: Neck supple.  Lymphadenopathy:     Cervical: No cervical adenopathy.  Skin:    General: Skin is warm and dry.     Findings: No rash.  Neurological:     Mental Status: He is alert and oriented to person, place, and time.  Psychiatric:        Behavior: Behavior normal.        Thought Content: Thought content normal.        Judgment: Judgment normal.         06/07/2022    8:42 AM 06/06/2022    9:10 AM 02/11/2018    8:56 AM 04/09/2011    9:27 AM 02/21/2011    9:35 AM  Depression screen PHQ 2/9  Decreased Interest 0 0 0 0 0  Down, Depressed, Hopeless 0 0 0 0 0  PHQ - 2 Score 0 0 0 0 0       Assessment & Plan:    Patient Active Problem List   Diagnosis Date Noted   Healthcare maintenance 06/07/2022   Grieving 12/05/2020   Tinea corporis 08/03/2018   Rash 08/03/2018   Tinea versicolor 08/03/2018   Routine general medical examination at a health care facility 10/23/2011   Blisters of multiple sites 04/09/2011   Dermatitis, seborrheic 04/09/2011   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 Restarted on his hydrochlorothiazide and amlodipine by Internal Medicine.         Other   HIV disease ([place])    Ronald [name] has been taking his Biktarvy as prescribed with no adverse side effects. Discussed importance of routine follow up and to inform clinic if there are changes to his insurance and there is assistance available in getting medication. Reviewed previous lab work and discussed plan of care. Check lab work today. Continue  current dose of Biktarvy. Plan for follow up with Dr. [name] in 6 months or sooner if needed with lab work 1-2 weeks prior to appointment.       Relevant Medications   bictegravir-emtricitabine-tenofovir AF (BIKTARVY) 50-200-25 MG TABS tablet   Other Relevant Orders   HIV-1 RNA quant-no reflex-bld   T-helper cells (CD4) count (not at [facility])   [place] maintenance    Discussed importance of safe sexual practices and condom use.  Condoms and STD testing offered. Routine vaccinations up-to-date. Referral placed to gastroenterology for colon cancer screening through colonoscopy. Referral placed to [facility] clinic for routine dental care.       Other Visit Diagnoses     At risk for colon cancer    -  Primary   Relevant Orders   Ambulatory referral to Gastroenterology   Screening for STDs (sexually transmitted diseases)       Relevant Orders   RPR        I am having Ronald [name] maintain his amLODipine, hydrochlorothiazide, and Biktarvy.   Meds ordered this encounter  Medications   bictegravir-emtricitabine-tenofovir AF (BIKTARVY) 50-200-25 MG TABS tablet    Sig: Take 1 tablet by mouth daily.    Dispense:  30  tablet    Refill:  5    .    Order Specific Question:   Supervising Provider    Answer:   Carlyle Basques [[id number]]     Follow-up: Return in about 6 months (around 12/07/2022).   Terri Piedra, MSN, FNP-C Nurse Practitioner [facility] for Infectious Disease [place] number: [phone]

## 2022-06-07 ENCOUNTER — Other Ambulatory Visit: Payer: Self-pay

## 2022-06-07 ENCOUNTER — Ambulatory Visit (INDEPENDENT_AMBULATORY_CARE_PROVIDER_SITE_OTHER): Payer: 59 | Admitting: Family

## 2022-06-07 ENCOUNTER — Encounter: Payer: Self-pay | Admitting: Family

## 2022-06-07 VITALS — BP 155/116 | HR 112 | Temp 98.4°F | Ht 66.0 in | Wt 181.0 lb

## 2022-06-07 DIAGNOSIS — B20 Human immunodeficiency virus [HIV] disease: Secondary | ICD-10-CM

## 2022-06-07 DIAGNOSIS — I10 Essential (primary) hypertension: Secondary | ICD-10-CM | POA: Diagnosis not present

## 2022-06-07 DIAGNOSIS — Z9189 Other specified personal risk factors, not elsewhere classified: Secondary | ICD-10-CM

## 2022-06-07 DIAGNOSIS — Z113 Encounter for screening for infections with a predominantly sexual mode of transmission: Secondary | ICD-10-CM

## 2022-06-07 DIAGNOSIS — Z0000 Encounter for general adult medical examination without abnormal findings: Secondary | ICD-10-CM | POA: Diagnosis not present

## 2022-06-07 LAB — COMPLETE METABOLIC PANEL WITH GFR
AG Ratio: 1.4 (calc) (ref 1.0–2.5)
ALT: 20 U/L (ref 9–46)
AST: 26 U/L (ref 10–40)
Albumin: 4.4 g/dL (ref 3.6–5.1)
Alkaline phosphatase (APISO): 75 U/L (ref 36–130)
BUN/Creatinine Ratio: 11 (calc) (ref 6–22)
BUN: 16 mg/dL (ref 7–25)
CO2: 26 mmol/L (ref 20–32)
Calcium: 9.8 mg/dL (ref 8.6–10.3)
Chloride: 103 mmol/L (ref 98–110)
Creat: 1.44 mg/dL — ABNORMAL HIGH (ref 0.60–1.29)
Globulin: 3.2 g/dL (calc) (ref 1.9–3.7)
Glucose, Bld: 99 mg/dL (ref 65–99)
Potassium: 4 mmol/L (ref 3.5–5.3)
Sodium: 139 mmol/L (ref 135–146)
Total Bilirubin: 0.6 mg/dL (ref 0.2–1.2)
Total Protein: 7.6 g/dL (ref 6.1–8.1)
eGFR: 61 mL/min/{1.73_m2} (ref >=60)

## 2022-06-07 LAB — LIPID PANEL
Cholesterol: 202 mg/dL — ABNORMAL HIGH (ref <200)
HDL: 55 mg/dL (ref >=40)
LDL Cholesterol (Calc): 132 mg/dL (calc) — ABNORMAL HIGH
Non-HDL Cholesterol (Calc): 147 mg/dL (calc) — ABNORMAL HIGH (ref <130)
Total CHOL/HDL Ratio: 3.7 (calc) (ref <5.0)
Triglycerides: 61 mg/dL (ref <150)

## 2022-06-07 MED ORDER — BIKTARVY 50-200-25 MG PO TABS: 1.0000 | ORAL_TABLET | Freq: Every day | ORAL | 5 refills | Status: DC

## 2022-06-07 NOTE — Assessment & Plan Note (Signed)
   Discussed importance of safe sexual practices and condom use.  Condoms and STD testing offered.  Routine vaccinations up-to-date.  Referral placed to gastroenterology for colon cancer screening through colonoscopy.  Referral placed to [facility] clinic for routine dental care.

## 2022-06-07 NOTE — Assessment & Plan Note (Signed)
Ronald [name]'s blood pressure is elevated today without neurological/ophthalmogic signs/symptoms. Restarted on his hydrochlorothiazide and amlodipine by Internal Medicine.

## 2022-06-07 NOTE — Patient Instructions (Addendum)
Nice to see you.  We will check your lab work today.  Continue to take your medication daily as prescribed.  Refills have been sent to the pharmacy.  Plan for follow up with Dr. [name] in  6 months or sooner if needed with lab work 1-2 weeks prior to appointment.   Have a great day and stay safe!

## 2022-06-07 NOTE — Assessment & Plan Note (Signed)
Ronald [name] has been taking his Biktarvy as prescribed with no adverse side effects. Discussed importance of routine follow up and to inform clinic if there are changes to his insurance and there is assistance available in getting medication. Reviewed previous lab work and discussed plan of care. Check lab work today. Continue current dose of Biktarvy. Plan for follow up with Dr. [name] in 6 months or sooner if needed with lab work 1-2 weeks prior to appointment.

## 2022-06-10 LAB — RPR: RPR Ser Ql: NONREACTIVE

## 2022-06-10 LAB — HIV-1 RNA QUANT-NO REFLEX-BLD
HIV 1 RNA Quant: NOT DETECTED Copies/mL
HIV-1 RNA Quant, Log: NOT DETECTED Log cps/mL

## 2022-06-10 LAB — T-HELPER CELLS (CD4) COUNT (NOT AT ARMC)
Absolute CD4: 315 cells/uL — ABNORMAL LOW (ref 490–1740)
CD4 T Helper %: 27 % — ABNORMAL LOW (ref 30–61)
Total lymphocyte count: 1168 cells/uL (ref 850–3900)

## 2022-06-28 ENCOUNTER — Encounter: Payer: Self-pay | Admitting: Gastroenterology

## 2022-07-04 ENCOUNTER — Ambulatory Visit: Payer: 59 | Admitting: Orthopedic Surgery

## 2022-07-04 ENCOUNTER — Encounter: Payer: Self-pay | Admitting: Orthopedic Surgery

## 2022-07-04 VITALS — BP 140/90 | HR 81 | Temp 98.5°F | Resp 16 | Ht 66.0 in | Wt 184.2 lb

## 2022-07-04 DIAGNOSIS — E7800 Pure hypercholesterolemia, unspecified: Secondary | ICD-10-CM

## 2022-07-04 DIAGNOSIS — B20 Human immunodeficiency virus [HIV] disease: Secondary | ICD-10-CM

## 2022-07-04 DIAGNOSIS — F17210 Nicotine dependence, cigarettes, uncomplicated: Secondary | ICD-10-CM

## 2022-07-04 DIAGNOSIS — I10 Essential (primary) hypertension: Secondary | ICD-10-CM

## 2022-07-04 MED ORDER — AMLODIPINE BESYLATE 10 MG PO TABS: 10.0000 mg | ORAL_TABLET | Freq: Every day | ORAL | 1 refills | Status: DC

## 2022-07-04 MED ORDER — LOSARTAN POTASSIUM-HCTZ 50-12.5 MG PO TABS: 1.0000 | ORAL_TABLET | Freq: Every day | ORAL | 1 refills | Status: DC

## 2022-07-04 NOTE — Progress Notes (Signed)
Careteam: Patient Care Team: Octavia Heir, NP as PCP - General (Adult Health Nurse Practitioner) Daiva Eves, Lisette Grinder, MD as PCP - Infectious Diseases (Infectious Diseases)  Seen by: Hazle Nordmann, AGNP-C  PLACE OF SERVICE:  [facility]  Advanced Directive information Does Patient Have a Medical Advance Directive?: No, Would patient like information on creating a medical advance directive?: No - Patient declined  Allergies  Allergen Reactions   Ibuprofen Other (See Comments)    BLISTERS    Chief Complaint  Patient presents with   Follow-up    4 week follow up on high blood pressure.     HPI: Patient is a 46 y.o. male seen today for medical management of chronic conditions.   No health concerns.   He has been taking amlodipine and HCTZ daily. Admits to feeling better. He has only taken blood pressure once since last encounter. He has cut down on sodium. Denies chest pain, sob, headaches or blurred vision.   10/06 he was evaluated by I/D, viral load undetectable. Remains on Biktarvy.   He reduced smoking, reports having 3 cigarettes daily.   Colonoscopy scheduled later this month.    Review of Systems:  Review of Systems  Constitutional:  Negative for chills and fever.  HENT:  Negative for congestion.   Eyes:  Negative for blurred vision and double vision.  Respiratory:  Negative for cough, shortness of breath and wheezing.   Cardiovascular:  Negative for chest pain and leg swelling.  Gastrointestinal:  Negative for blood in stool, constipation and heartburn.  Genitourinary:  Negative for dysuria.  Musculoskeletal:  Negative for joint pain.  Neurological:  Negative for dizziness, weakness and headaches.  Psychiatric/Behavioral:  Negative for depression. The patient is not nervous/anxious and does not have insomnia.     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   Grieving 12/05/2020   HIV infection (HCC)    Rash 08/03/2018   Tinea corporis  08/03/2018   Tinea versicolor 08/03/2018   Past Surgical History:  Procedure Laterality Date   EXAMINATION UNDER ANESTHESIA  12/27/2011   Procedure: EXAM UNDER ANESTHESIA;  Surgeon: Shelly Rubenstein, MD;  Location: [facility];  Service: General;  Laterality: N/A;   NO PAST SURGERIES     WART FULGURATION  12/27/2011   Procedure: FULGURATION ANAL WART;  Surgeon: Shelly Rubenstein, MD;  Location: [facility];  Service: General;  Laterality: N/A;  Excision anal condyloma   Social History:   reports that he has been smoking cigarettes. He has a 7.20 pack-year smoking history. He has never used smokeless tobacco. He reports that he does not currently use alcohol. He reports that he does not use drugs.  Family History  Problem Relation Age of Onset   Hypertension Mother    Cancer Father    Diabetes Maternal Aunt    Seizures Paternal Aunt     Medications: Patient's Medications  New Prescriptions   No medications on file  Previous Medications   AMLODIPINE (NORVASC) 10 MG TABLET    Take 1 tablet (10 mg total) by mouth daily.   BICTEGRAVIR-EMTRICITABINE-TENOFOVIR AF (BIKTARVY) 50-200-25 MG TABS TABLET    Take 1 tablet by mouth daily.   HYDROCHLOROTHIAZIDE (HYDRODIURIL) 25 MG TABLET    TAKE 1 TABLET(25 MG) BY MOUTH DAILY  Modified Medications   No medications on file  Discontinued Medications   No medications on file    Physical Exam:  Vitals:  07/04/22 0850  BP: (!) 138/100  Pulse: 81  Resp: 16  Temp: 98.5 F (36.9 C)  SpO2: 98%  Weight: 184 lb 3.2 oz (83.6 kg)  Height: 5\' 6"  (1.676 m)   Body mass index is 29.73 kg/m. Wt Readings from Last 3 Encounters:  07/04/22 184 lb 3.2 oz (83.6 kg)  06/07/22 181 lb (82.1 kg)  06/06/22 182 lb 12.8 oz (82.9 kg)    Physical Exam Vitals reviewed.  Constitutional:      General: He is not in acute distress. HENT:     Head: Normocephalic.  Eyes:     General:        Right eye: No discharge.        Left  eye: No discharge.  Cardiovascular:     Rate and Rhythm: Normal rate and regular rhythm.     Pulses: Normal pulses.     Heart sounds: Normal heart sounds.  Pulmonary:     Effort: Pulmonary effort is normal. No respiratory distress.     Breath sounds: Normal breath sounds. No wheezing.  Abdominal:     General: Bowel sounds are normal. There is no distension.     Palpations: Abdomen is soft.     Tenderness: There is no abdominal tenderness.  Musculoskeletal:     Cervical back: Neck supple.     Right lower leg: No edema.     Left lower leg: No edema.  Skin:    General: Skin is warm and dry.     Capillary Refill: Capillary refill takes less than 2 seconds.  Neurological:     General: No focal deficit present.     Mental Status: He is alert and oriented to person, place, and time.  Psychiatric:        Mood and Affect: Mood normal.        Behavior: Behavior normal.     Labs reviewed: Basic Metabolic Panel: Recent Labs    06/06/22 1005  NA 139  K 4.0  CL 103  CO2 26  GLUCOSE 99  BUN 16  CREATININE 1.44*  CALCIUM 9.8   Liver Function Tests: Recent Labs    06/06/22 1005  AST 26  ALT 20  BILITOT 0.6  PROT 7.6   No results for input(s): "LIPASE", "AMYLASE" in the last 8760 hours. No results for input(s): "AMMONIA" in the last 8760 hours. CBC: No results for input(s): "WBC", "NEUTROABS", "HGB", "HCT", "MCV", "PLT" in the last 8760 hours. Lipid Panel: Recent Labs    06/06/22 1005  CHOL 202*  HDL 55  LDLCALC 132*  TRIG 61  CHOLHDL 3.7   TSH: No results for input(s): "TSH" in the last 8760 hours. A1C: Lab Results  Component Value Date   HGBA1C 4.5 09/06/2019     Assessment/Plan 1. Essential hypertension - uncontrolled, goal < 130/80 - will add losartan to HCTZ - advised to recheck blood pressure 2x/day for 2 weeks - cont low sodium diet - losartan-hydrochlorothiazide (HYZAAR) 50-12.5 MG tablet; Take 1 tablet by mouth daily.  Dispense: 30 tablet;  Refill: 1 - amLODipine (NORVASC) 10 MG tablet; Take 1 tablet (10 mg total) by mouth daily.  Dispense: 90 tablet; Refill: 1  2. Hypercholesteremia - LDL 132 - discussed dietary precautions/ mediterranean diet   3. HIV disease (HCC) - followed by I/D - recent viral load undetected - cont Biktarvy  4. Smoking 1/2 pack a day or less - now smoking 3 cigarettes/day - discussed smoking cessation   Total time: 31 minutes.  Greater than 50% of total time spent doing patient education regarding elevated blood pressure and cholesterol including symptom/medication management.     Next appt: 08/01/2022  Niel Hummer  [facility] [phone]

## 2022-07-04 NOTE — Patient Instructions (Signed)
Mediterranean diet to help with cholesterol  Please take blood pressure 2x/day and record readings- please bring next appointment

## 2022-07-18 ENCOUNTER — Ambulatory Visit (AMBULATORY_SURGERY_CENTER): Payer: Self-pay

## 2022-07-18 VITALS — Ht 66.0 in | Wt 186.0 lb

## 2022-07-18 DIAGNOSIS — Z1211 Encounter for screening for malignant neoplasm of colon: Secondary | ICD-10-CM

## 2022-07-18 MED ORDER — NA SULFATE-K SULFATE-MG SULF 17.5-3.13-1.6 GM/177ML PO SOLN: 1.0000 | Freq: Once | ORAL | 0 refills | Status: AC

## 2022-07-18 NOTE — Progress Notes (Signed)

## 2022-07-23 ENCOUNTER — Encounter: Payer: Self-pay | Admitting: Gastroenterology

## 2022-07-28 ENCOUNTER — Other Ambulatory Visit: Payer: Self-pay | Admitting: Orthopedic Surgery

## 2022-07-28 DIAGNOSIS — I10 Essential (primary) hypertension: Secondary | ICD-10-CM

## 2022-07-29 ENCOUNTER — Encounter: Payer: 59 | Admitting: Gastroenterology

## 2022-07-29 MED ORDER — LOSARTAN POTASSIUM-HCTZ 50-12.5 MG PO TABS: 1.0000 | ORAL_TABLET | Freq: Every day | ORAL | 1 refills | Status: DC

## 2022-07-29 NOTE — Addendum Note (Signed)
Addended by: Michaell Cowing on: 07/29/2022 08:58 AM   Modules accepted: Orders

## 2022-08-01 ENCOUNTER — Encounter: Payer: 59 | Admitting: Orthopedic Surgery

## 2022-08-02 NOTE — Progress Notes (Signed)
This encounter was created in error - please disregard.

## 2022-08-08 ENCOUNTER — Encounter: Payer: 59 | Admitting: Orthopedic Surgery

## 2022-08-09 NOTE — Progress Notes (Signed)
This encounter was created in error - please disregard.

## 2022-09-26 ENCOUNTER — Encounter: Payer: Self-pay | Admitting: Orthopedic Surgery

## 2022-09-26 ENCOUNTER — Ambulatory Visit: Payer: 59 | Admitting: Orthopedic Surgery

## 2022-09-26 VITALS — BP 130/80 | HR 80 | Temp 96.4°F | Resp 18 | Ht 66.0 in | Wt 179.4 lb

## 2022-09-26 DIAGNOSIS — E7800 Pure hypercholesterolemia, unspecified: Secondary | ICD-10-CM | POA: Diagnosis not present

## 2022-09-26 DIAGNOSIS — Z1211 Encounter for screening for malignant neoplasm of colon: Secondary | ICD-10-CM

## 2022-09-26 DIAGNOSIS — I10 Essential (primary) hypertension: Secondary | ICD-10-CM

## 2022-09-26 DIAGNOSIS — Z6828 Body mass index (BMI) 28.0-28.9, adult: Secondary | ICD-10-CM

## 2022-09-26 DIAGNOSIS — F17210 Nicotine dependence, cigarettes, uncomplicated: Secondary | ICD-10-CM | POA: Diagnosis not present

## 2022-09-26 DIAGNOSIS — B20 Human immunodeficiency virus [HIV] disease: Secondary | ICD-10-CM | POA: Diagnosis not present

## 2022-09-26 MED ORDER — AMLODIPINE BESYLATE 10 MG PO TABS: 10.0000 mg | ORAL_TABLET | Freq: Every day | ORAL | 2 refills | Status: DC

## 2022-09-26 MED ORDER — LOSARTAN POTASSIUM-HCTZ 50-12.5 MG PO TABS: 1.0000 | ORAL_TABLET | Freq: Every day | ORAL | 2 refills | Status: DC

## 2022-09-26 NOTE — Progress Notes (Signed)
Careteam: Patient Care Team: Yvonna Alanis, NP as PCP - General (Adult Health Nurse Practitioner) [name], Lavell Islam, MD as PCP - Infectious Diseases (Infectious Diseases)  Seen by: Windell Moulding, AGNP-C  PLACE OF SERVICE:  [place] Directive information    Allergies  Allergen Reactions   Ibuprofen Other (See Comments)    [place]    Chief Complaint  Patient presents with   Follow-up    Four Week follow-up   Quality Metric Gaps    Needs to discuss TDAP, COVID Booster vaccine and Colonoscopy. [facility] verified     HPI: Patient is a 47 y.o. male seen today for medical management of chronic conditions.   HTN- he has been taking pressures at home, did not bring readings today, bp stable in office today, denies chest pain, sob, blurred vision or headaches, remains on amlodipine and Hyzaar as prescribed  HLD- LDL 132 06/2022, not always following low fat options, plan to recheck today  HIV- followed by infectious disease, remains on Biktarvy  Smoking- trying to cut down with a goal to eventually quit. Smoking about 1 pack per week.   Drinking less soda and more water. Weight down 5 lbs.   Discussed need for Tdap and colonoscopy. Scheduled to have colonoscopy a few months back, but cancelled due to flu.   Review of Systems:  Review of Systems  Constitutional: Negative.   HENT: Negative.    Eyes: Negative.   Respiratory: Negative.    Cardiovascular: Negative.   Gastrointestinal: Negative.   Genitourinary: Negative.   Musculoskeletal: Negative.   Skin: Negative.   Neurological: Negative.   Endo/Heme/Allergies: Negative.   Psychiatric/Behavioral: Negative.      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   Grieving 12/05/2020   HIV infection ([place])    Rash 08/03/2018   Tinea corporis 08/03/2018   Tinea versicolor 08/03/2018   Past Surgical History:  Procedure Laterality Date   EXAMINATION UNDER ANESTHESIA  12/27/2011   Procedure: EXAM  UNDER ANESTHESIA;  Surgeon: Harl Bowie, MD;  Location: [facility];  Service: General;  Laterality: N/A;   NO PAST SURGERIES     WART FULGURATION  12/27/2011   Procedure: FULGURATION ANAL WART;  Surgeon: Harl Bowie, MD;  Location: [place];  Service: General;  Laterality: N/A;  Excision anal condyloma   Social History:   reports that he has been smoking cigarettes. He has a 7.20 pack-year smoking history. He has never used smokeless tobacco. He reports that he does not currently use alcohol. He reports that he does not use drugs.  Family History  Problem Relation Age of Onset   Hypertension Mother    Cancer Father    Diabetes Maternal Aunt    Seizures Paternal Aunt    Colon cancer Neg Hx    Colon polyps Neg Hx    Esophageal cancer Neg Hx    Rectal cancer Neg Hx    Stomach cancer Neg Hx     Medications: Patient's Medications  New Prescriptions   No medications on file  Previous Medications   AMLODIPINE (NORVASC) 10 MG TABLET    Take 1 tablet (10 mg total) by mouth daily.   BICTEGRAVIR-EMTRICITABINE-TENOFOVIR AF (BIKTARVY) 50-200-25 MG TABS TABLET    Take 1 tablet by mouth daily.   LOSARTAN-HYDROCHLOROTHIAZIDE (HYZAAR) 50-12.5 MG TABLET    Take 1 tablet by mouth daily.  Modified Medications   No medications on file  Discontinued Medications   No medications on file    Physical Exam:  There were no vitals filed for this visit. There is no height or weight on file to calculate BMI. Wt Readings from Last 3 Encounters:  07/18/22 186 lb (84.4 kg)  07/04/22 184 lb 3.2 oz (83.6 kg)  06/07/22 181 lb (82.1 kg)    Physical Exam Vitals reviewed.  Constitutional:      General: He is not in acute distress. HENT:     Head: Normocephalic.  Eyes:     General:        Right eye: No discharge.        Left eye: No discharge.  Cardiovascular:     Rate and Rhythm: Normal rate and regular rhythm.     Pulses: Normal pulses.     Heart  sounds: Normal heart sounds.  Pulmonary:     Effort: Pulmonary effort is normal. No respiratory distress.     Breath sounds: Normal breath sounds. No wheezing.  Abdominal:     General: Bowel sounds are normal.     Palpations: Abdomen is soft.  Musculoskeletal:     Cervical back: Neck supple.     Right lower leg: No edema.     Left lower leg: No edema.  Skin:    General: Skin is warm and dry.     Capillary Refill: Capillary refill takes less than 2 seconds.  Neurological:     General: No focal deficit present.     Mental Status: He is alert and oriented to person, place, and time.  Psychiatric:        Mood and Affect: Mood normal.        Behavior: Behavior normal.     Labs reviewed: Basic Metabolic Panel: Recent Labs    06/06/22 1005  NA 139  K 4.0  CL 103  CO2 26  GLUCOSE 99  BUN 16  CREATININE 1.44*  CALCIUM 9.8   Liver Function Tests: Recent Labs    06/06/22 1005  AST 26  ALT 20  BILITOT 0.6  PROT 7.6   No results for input(s): "LIPASE", "AMYLASE" in the last 8760 hours. No results for input(s): "AMMONIA" in the last 8760 hours. CBC: No results for input(s): "WBC", "NEUTROABS", "HGB", "HCT", "MCV", "PLT" in the last 8760 hours. Lipid Panel: Recent Labs    06/06/22 1005  CHOL 202*  HDL 55  LDLCALC 132*  TRIG 61  CHOLHDL 3.7   TSH: No results for input(s): "TSH" in the last 8760 hours. A1C: Lab Results  Component Value Date   HGBA1C 4.5 09/06/2019     Assessment/Plan 1. Essential hypertension - controlled, goal < 130/80 - BUN/creat 16/1.44 06/06/2022 - amLODipine (NORVASC) 10 MG tablet; Take 1 tablet (10 mg total) by mouth daily.  Dispense: 90 tablet; Refill: 2 - losartan-hydrochlorothiazide (HYZAAR) 50-12.5 MG tablet; Take 1 tablet by mouth daily.  Dispense: 90 tablet; Refill: 2  2. Hypercholesteremia - LDL 132 06/06/2022 - cont diet low in fate and fried foods - Lipid Panel  3. HIV disease ([place]) - followed by infectious disease -  cont Biktarvy  4. Smoking 1/2 pack a day or less - cutting down with plans to quit - now smoking 1 pack per week  5. BMI 28.0-28.9,adult - drinking less soda and more water - weight down 5 lbs - BMI 28.95  6. Encounter for screening colonoscopy - missed scheduled appointment due to flu - requesting new referral to schedule - Ambulatory referral to  Gastroenterology  Total time: 28 minutes. Greater than 50% of total time spent doing patient education regarding health maintenance, HTN, HLD, smoking cessation and HIV.     Next appt: Visit date not found  Cayce Paschal Scherry Ran  [facility] [phone]

## 2022-09-26 NOTE — Patient Instructions (Addendum)
Please get tetanus shot at local pharmacy> LET us KNOW WHEN YOU GET IT  [facility]

## 2022-09-27 LAB — LIPID PANEL
Cholesterol: 159 mg/dL (ref <200)
HDL: 48 mg/dL (ref >=40)
LDL Cholesterol (Calc): 97 mg/dL (calc)
Non-HDL Cholesterol (Calc): 111 mg/dL (calc) (ref <130)
Total CHOL/HDL Ratio: 3.3 (calc) (ref <5.0)
Triglycerides: 57 mg/dL (ref <150)

## 2022-10-28 ENCOUNTER — Encounter: Payer: Self-pay | Admitting: Orthopedic Surgery

## 2022-10-31 ENCOUNTER — Ambulatory Visit: Payer: 59 | Admitting: Adult Health

## 2022-10-31 ENCOUNTER — Encounter: Payer: Self-pay | Admitting: Adult Health

## 2022-10-31 VITALS — BP 130/88 | HR 83 | Temp 97.6°F | Resp 18 | Ht 66.0 in | Wt 180.0 lb

## 2022-10-31 DIAGNOSIS — M25512 Pain in left shoulder: Secondary | ICD-10-CM

## 2022-10-31 DIAGNOSIS — I10 Essential (primary) hypertension: Secondary | ICD-10-CM | POA: Diagnosis not present

## 2022-10-31 DIAGNOSIS — M25511 Pain in right shoulder: Secondary | ICD-10-CM

## 2022-10-31 NOTE — Progress Notes (Signed)
[facility] clinic  Provider:  Durenda Age DNP  Code Status:  Full Code  Goals of Care:     07/04/2022    8:55 AM  Advanced Directives  Does Patient Have a Medical Advance Directive? No  Would patient like information on creating a medical advance directive? No - Patient declined     Chief Complaint  Patient presents with   Acute Visit    Patient is having shoulder pain    HPI: Patient is a 47 y.o. male seen today for an acute visit for bilateral shoulder pain.He has been having pain on bilateral shoulders for 3 weeks. He works as a [employer]ar[employer] manager. He has been using Biofreeze gel topically to his shoulders.  BP 130/88, takes Amlodipine and Losartan-HCTZ for hypertension.  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   Grieving 12/05/2020   HIV infection ([place])    Rash 08/03/2018   Tinea corporis 08/03/2018   Tinea versicolor 08/03/2018    Past Surgical History:  Procedure Laterality Date   EXAMINATION UNDER ANESTHESIA  12/27/2011   Procedure: EXAM UNDER ANESTHESIA;  Surgeon: Harl Bowie, MD;  Location: [place];  Service: General;  Laterality: N/A;   NO PAST SURGERIES     WART FULGURATION  12/27/2011   Procedure: FULGURATION ANAL WART;  Surgeon: Harl Bowie, MD;  Location: [place];  Service: General;  Laterality: N/A;  Excision anal condyloma    Allergies  Allergen Reactions   Ibuprofen Other (See Comments)    BLISTERS    Outpatient Encounter Medications as of 10/31/2022  Medication Sig   amLODipine (NORVASC) 10 MG tablet Take 1 tablet (10 mg total) by mouth daily.   bictegravir-emtricitabine-tenofovir AF (BIKTARVY) 50-200-25 MG TABS tablet Take 1 tablet by mouth daily.   losartan-hydrochlorothiazide (HYZAAR) 50-12.5 MG tablet Take 1 tablet by mouth daily.   No facility-administered encounter medications on file as of 10/31/2022.    Review of Systems:  Review of Systems  Constitutional:  Negative for activity  change, appetite change and fever.  HENT:  Negative for sore throat.   Eyes: Negative.   Cardiovascular:  Negative for chest pain and leg swelling.  Gastrointestinal:  Negative for abdominal distention, diarrhea and vomiting.  Genitourinary:  Negative for dysuria, frequency and urgency.  Musculoskeletal:        Bilateral shoulder pain  Skin:  Negative for color change.  Neurological:  Negative for dizziness and headaches.  Psychiatric/Behavioral:  Negative for behavioral problems and sleep disturbance. The patient is not nervous/anxious.     Health Maintenance  Topic Date Due   DTaP/Tdap/Td (1 - Tdap) Never done   COLONOSCOPY (Pts 45-32yr Insurance coverage will need to be confirmed)  Never done   COVID-19 Vaccine (3 - Moderna risk series) 04/03/2023 (Originally 08/17/2020)   INFLUENZA VACCINE  Completed   Hepatitis C Screening  Completed   HIV Screening  Completed   HPV VACCINES  Aged Out    Physical Exam: Vitals:   10/31/22 1506  BP: 130/88  Pulse: 83  Resp: 18  Temp: 97.6 F (36.4 C)  SpO2: 96%  Weight: 180 lb (81.6 kg)  Height: '5\' 6"'$  (1.676 m)   Body mass index is 29.05 kg/m. Physical Exam Constitutional:      Appearance: Normal appearance.  HENT:     Head: Normocephalic and atraumatic.     Mouth/Throat:     Mouth: Mucous membranes are moist.  Eyes:     Conjunctiva/sclera:  Conjunctivae normal.  Cardiovascular:     Rate and Rhythm: Normal rate and regular rhythm.     Pulses: Normal pulses.     Heart sounds: Normal heart sounds.  Pulmonary:     Effort: Pulmonary effort is normal.     Breath sounds: Normal breath sounds.  Abdominal:     General: Bowel sounds are normal.     Palpations: Abdomen is soft.  Musculoskeletal:        General: No swelling. Normal range of motion.     Cervical back: Normal range of motion.  Skin:    General: Skin is warm and dry.  Neurological:     General: No focal deficit present.     Mental Status: He is alert and oriented  to person, place, and time.  Psychiatric:        Mood and Affect: Mood normal.        Behavior: Behavior normal.        Thought Content: Thought content normal.        Judgment: Judgment normal.     Labs reviewed: Basic Metabolic Panel: Recent Labs    06/06/22 1005  NA 139  K 4.0  CL 103  CO2 26  GLUCOSE 99  BUN 16  CREATININE 1.44*  CALCIUM 9.8   Liver Function Tests: Recent Labs    06/06/22 1005  AST 26  ALT 20  BILITOT 0.6  PROT 7.6   No results for input(s): "LIPASE", "AMYLASE" in the last 8760 hours. No results for input(s): "AMMONIA" in the last 8760 hours. CBC: No results for input(s): "WBC", "NEUTROABS", "HGB", "HCT", "MCV", "PLT" in the last 8760 hours. Lipid Panel: Recent Labs    06/06/22 1005 09/26/22 0958  CHOL 202* 159  HDL 55 48  LDLCALC 132* 97  TRIG 61 57  CHOLHDL 3.7 3.3   Lab Results  Component Value Date   HGBA1C 4.5 09/06/2019    Procedures since last visit: No results found.  Assessment/Plan  1. Acute pain of both shoulders -  continue Biofreeze gel -  may apply warm compress TID - DG Shoulder Left; Future - DG Shoulder Right; Future  2. Essential hypertension -  BPs stable -   continue current medications    Labs/tests ordered:    - DG Shoulder Left; Future - DG Shoulder Right; Future  Next appt:  03/27/2023

## 2022-10-31 NOTE — Patient Instructions (Signed)
Shoulder Pain Many things can cause shoulder pain, including: An injury to the shoulder. Overuse of the shoulder. Arthritis. The source of the pain can be: Inflammation. An injury to the shoulder joint. An injury to a tendon, ligament, or bone. Follow these instructions at home: Pay attention to changes in your symptoms. Let your health care provider know about them. Follow these instructions to relieve your pain. If you have a removable sling: Wear the sling as told by your provider. Remove it only as told by your provider. Check the skin around the sling every day. Tell your provider about any concerns. Loosen the sling if your fingers tingle, become numb, or become cold. Keep the sling clean. If the sling is not waterproof: Do not let it get wet. Remove it to shower or bathe. Move your arm as little as possible, but keep your hand moving to prevent swelling. Managing pain, stiffness, and swelling  If told, put ice on the painful area. If you have a removable sling or immobilizer, remove it as told by your provider. Put ice in a plastic bag. Place a towel between your skin and the bag. Leave the ice on for 20 minutes, 2-3 times a day. If your skin turns bright red, remove the ice right away to prevent skin damage. The risk of damage is higher if you cannot feel pain, heat, or cold. Move your fingers often to reduce stiffness and swelling. Squeeze a soft ball or a foam pad as much as possible. This helps to keep the shoulder from swelling. It also helps to strengthen the arm. General instructions Take over-the-counter and prescription medicines only as told by your provider. Exercise may help with pain management. Perform exercises if told by your provider. You may be referred to a physical therapist to help in your recovery process. Keep all follow-up visits in order to avoid any type of permanent shoulder disability or chronic pain problems. Contact a health care provider  if: Your pain is not relieved with medicines. New pain develops in your arm, hand, or fingers. You loosen your sling and your arm, hand, or fingers remain tingly, numb, swollen, or painful. Get help right away if: Your arm, hand, or fingers turn white or blue. This information is not intended to replace advice given to you by your health care provider. Make sure you discuss any questions you have with your health care provider. Document Revised: 03/22/2022 Document Reviewed: 03/22/2022 Elsevier Patient Education  [place].

## 2022-11-08 ENCOUNTER — Ambulatory Visit
Admission: RE | Admit: 2022-11-08 | Discharge: 2022-11-08 | Disposition: A | Discharge status: Home / Self Care | Payer: 59 | Source: Ambulatory Visit | Attending: Adult Health | Admitting: Adult Health

## 2022-11-08 DIAGNOSIS — M25511 Pain in right shoulder: Secondary | ICD-10-CM

## 2022-11-12 NOTE — Progress Notes (Signed)
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 May take as needed Ibuprofen or Tylenol. May use heating pad as needed for pain.

## 2022-11-12 NOTE — Progress Notes (Signed)
Left shoulder imaging is negative for fracture or dislocation.

## 2022-12-03 ENCOUNTER — Encounter (HOSPITAL_BASED_OUTPATIENT_CLINIC_OR_DEPARTMENT_OTHER): Payer: Self-pay | Admitting: Emergency Medicine

## 2022-12-03 ENCOUNTER — Emergency Department (HOSPITAL_BASED_OUTPATIENT_CLINIC_OR_DEPARTMENT_OTHER)
Admission: EM | Admit: 2022-12-03 | Discharge: 2022-12-03 | Disposition: A | Discharge status: Home / Self Care | Payer: 59 | Attending: Emergency Medicine | Admitting: Emergency Medicine

## 2022-12-03 ENCOUNTER — Telehealth: Payer: Self-pay

## 2022-12-03 ENCOUNTER — Other Ambulatory Visit: Payer: Self-pay

## 2022-12-03 DIAGNOSIS — H66001 Acute suppurative otitis media without spontaneous rupture of ear drum, right ear: Secondary | ICD-10-CM | POA: Diagnosis not present

## 2022-12-03 DIAGNOSIS — Z1152 Encounter for screening for COVID-19: Secondary | ICD-10-CM | POA: Diagnosis not present

## 2022-12-03 DIAGNOSIS — R030 Elevated blood-pressure reading, without diagnosis of hypertension: Secondary | ICD-10-CM

## 2022-12-03 DIAGNOSIS — I10 Essential (primary) hypertension: Secondary | ICD-10-CM | POA: Insufficient documentation

## 2022-12-03 DIAGNOSIS — H9201 Otalgia, right ear: Secondary | ICD-10-CM | POA: Diagnosis present

## 2022-12-03 LAB — RESP PANEL BY RT-PCR (RSV, FLU A&B, COVID)  RVPGX2
Influenza A by PCR: NEGATIVE
Influenza B by PCR: NEGATIVE
Resp Syncytial Virus by PCR: NEGATIVE
SARS Coronavirus 2 by RT PCR: NEGATIVE

## 2022-12-03 MED ORDER — ACETAMINOPHEN 500 MG PO TABS
1000.0000 mg | ORAL_TABLET | Freq: Once | ORAL | Status: AC
  Administered 2022-12-03: 1000 mg via ORAL
  Filled 2022-12-03: qty 2

## 2022-12-03 MED ORDER — AMOXICILLIN 500 MG PO CAPS
1000.0000 mg | ORAL_CAPSULE | Freq: Once | ORAL | Status: AC
  Administered 2022-12-03: 1000 mg via ORAL
  Filled 2022-12-03: qty 2

## 2022-12-03 MED ORDER — AMOXICILLIN 500 MG PO CAPS: 500.0000 mg | ORAL_CAPSULE | Freq: Three times a day (TID) | ORAL | 0 refills | Status: DC

## 2022-12-03 NOTE — ED Notes (Signed)
Discharge instructions reviewed with patient. Patient verbalizes understanding, no further questions at this time. Medications/prescriptions and follow up information provided. No acute distress noted at time of departure.  

## 2022-12-03 NOTE — ED Triage Notes (Signed)
Right earache x 4 days , yesterday started  have dizziness.

## 2022-12-03 NOTE — ED Provider Notes (Signed)
[place] [facility] [facility] Provider Note   CSN: [id number] Arrival date & time: 12/03/22  [id number]     History  Chief Complaint  Patient presents with   Otalgia    right    Ronald [name] is a 47 y.o. male.  Pt c/o right earache for past 3-4 days. Symptoms acute onset, moderate, dull. No hx frequent earaches or ear infection. No drainage from ear. No headache. No neck pain or stiffness. No sore throat or trouble swallowing. No cough. No fever or chills. Denies trauma to area.   The history is provided by the patient and medical records.  Otalgia Associated symptoms: no cough, no fever, no headaches, no neck pain, no rash, no sore throat, no tinnitus and no vomiting        Home Medications Prior to Admission medications   Medication Sig Start Date End Date Taking? Authorizing Provider  amLODipine (NORVASC) 10 MG tablet Take 1 tablet (10 mg total) by mouth daily. 09/26/22   Fargo, Amy E, NP  bictegravir-emtricitabine-tenofovir AF (BIKTARVY) 50-200-25 MG TABS tablet Take 1 tablet by mouth daily. 06/07/22   Golden Circle, FNP  losartan-hydrochlorothiazide (HYZAAR) 50-12.5 MG tablet Take 1 tablet by mouth daily. 09/26/22   Yvonna Alanis, NP      Allergies    Ibuprofen    Review of Systems   Review of Systems  Constitutional:  Negative for chills and fever.  HENT:  Positive for ear pain. Negative for sore throat and tinnitus.   Eyes:  Negative for pain and redness.  Respiratory:  Negative for cough.   Gastrointestinal:  Negative for nausea and vomiting.  Musculoskeletal:  Negative for neck pain and neck stiffness.  Skin:  Negative for rash.  Neurological:  Negative for headaches.    Physical Exam Updated Vital Signs BP (!) 141/108   Pulse 92   Temp 97.9 F (36.6 C)   Resp 18   Wt 84.8 kg   SpO2 99%   BMI 30.18 kg/m  Physical Exam Vitals and nursing note reviewed.  Constitutional:      Appearance: Normal appearance. He is  well-developed.  HENT:     Head: Atraumatic.     Right Ear: Ear canal and external ear normal. There is no impacted cerumen.     Left Ear: Tympanic membrane, ear canal and external ear normal. There is no impacted cerumen.     Ears:     Comments: Right tm w erythema/bulging posteriorly.     Nose: Nose normal.     Mouth/Throat:     Mouth: Mucous membranes are moist.     Pharynx: Oropharynx is clear.  Eyes:     General: No scleral icterus.    Conjunctiva/sclera: Conjunctivae normal.  Neck:     Trachea: No tracheal deviation.     Comments: No stoiffness/rigidity. Mild right l/a.  Cardiovascular:     Rate and Rhythm: Normal rate.  Pulmonary:     Effort: Pulmonary effort is normal. No accessory muscle usage or respiratory distress.  Musculoskeletal:        General: No swelling.     Cervical back: Normal range of motion and neck supple. No rigidity.  Lymphadenopathy:     Cervical: Cervical adenopathy present.  Skin:    General: Skin is warm and dry.     Findings: No rash.  Neurological:     Mental Status: He is alert.     Comments: Alert, speech clear. Steady gait.  Psychiatric:        Mood and Affect: Mood normal.     ED Results / Procedures / Treatments   Labs (all labs ordered are listed, but only abnormal results are displayed) Labs Reviewed  RESP PANEL BY RT-PCR (RSV, FLU A&B, COVID)  [id number]    EKG None  Radiology No results found.  Procedures Procedures    Medications Ordered in ED Medications  acetaminophen (TYLENOL) tablet 1,000 mg (has no administration in time range)  amoxicillin (AMOXIL) capsule 1,000 mg (has no administration in time range)    ED Course/ Medical Decision Making/ A&P                             Medical Decision Making Problems Addressed: Elevated blood pressure reading: acute illness or injury Essential hypertension: chronic illness or injury with exacerbation, progression, or side effects of treatment that poses a threat to  life or bodily functions Non-recurrent acute suppurative otitis media of right ear without spontaneous rupture of tympanic membrane: acute illness or injury with systemic symptoms  Amount and/or Complexity of Data Reviewed External Data Reviewed: notes. Labs: ordered. Decision-making details documented in ED Course.  Risk OTC drugs. Prescription drug management.   Labs sent from triage.   Reviewed nursing notes and prior charts for additional history.   Only noted allergy is ibuprofen.  Amoxicillin po. Acetaminophen po.  Rx for home.  Return precautions provided.           Final Clinical Impression(s) / ED Diagnoses Final diagnoses:  None    Rx / DC Orders ED Discharge Orders     None         Lajean Saver, MD 12/03/22 [id number]

## 2022-12-03 NOTE — Transitions of Care (Post Inpatient/ED Visit) (Signed)
   12/03/2022  Name: Jamesmichael Goodrum MRN: [id number] DOB: 02/27/1976  Today's TOC FU Call Status: Today's TOC FU Call Status:: Successful TOC FU Call Competed Unsuccessful Call (1st Attempt) Date: 12/03/22 [facility] FU Call Complete Date: 12/03/22  Transition Care Management Follow-up Telephone Call Date of Discharge: 12/03/22 Discharge Facility: [facility]) Type of Discharge: Emergency Department Reason for ED Visit:  (Otalgia Right) How have you been since you were released from the hospital?: Same Any questions or concerns?: No  Items Reviewed: Did you receive and understand the discharge instructions provided?: Yes Medications obtained and verified?: Yes (Medications Reviewed) Any new allergies since your discharge?: No Dietary orders reviewed?: No Do you have support at home?: Yes People in Home: spouse Name of Support/Comfort Primary Source: [facility] and Equipment/Supplies: [place] Ordered?: No Any new equipment or medical supplies ordered?: No  Functional Questionnaire: Do you need assistance with bathing/showering or dressing?: No Do you need assistance with meal preparation?: No Do you need assistance with eating?: No Do you have difficulty maintaining continence: No Do you need assistance with getting out of bed/getting out of a chair/moving?: No Do you have difficulty managing or taking your medications?: No  Follow up appointments reviewed: PCP Follow-up appointment confirmed?: Yes Date of PCP follow-up appointment?: 12/05/22 Follow-up Provider: Windell Moulding, NP [facility] Follow-up appointment confirmed?: No Do you need transportation to your follow-up appointment?: No Do you understand care options if your condition(s) worsen?: Yes-patient verbalized understanding    SIGNATURE: [place].D/RMA

## 2022-12-03 NOTE — Discharge Instructions (Addendum)
It was our pleasure to provide your ER care today - we hope that you feel better.  Take amoxicillin as prescribed.   Take acetaminophen as need.   Follow up with ENT doctor in one week if symptoms fail to improve/resolve.  Your blood pressure is noted to be high today - continue your meds, and follow up with primary care doctor in next couple weeks.   Return to ER if worse, new symptoms, high fevers, severe headache, severe ear pain, or other concern.

## 2022-12-03 NOTE — Transitions of Care (Post Inpatient/ED Visit) (Signed)
   12/03/2022  Name: Ronald [name] MRN: [id number] DOB: December 15, 1975  Today's TOC FU Call Status: Unsuccessful Call (1st Attempt) Date: 12/03/22  Attempted to reach the patient regarding the most recent Inpatient/ED visit.  Follow Up Plan: Additional outreach attempts will be made to reach the patient to complete the Transitions of Care (Post Inpatient/ED visit) call.   Signature: Beauford Lando.D/RMA

## 2022-12-05 ENCOUNTER — Encounter: Payer: Self-pay | Admitting: Orthopedic Surgery

## 2022-12-05 ENCOUNTER — Ambulatory Visit: Payer: 59 | Admitting: Orthopedic Surgery

## 2022-12-05 VITALS — BP 118/88 | HR 84 | Temp 98.9°F | Resp 16 | Ht 66.0 in | Wt 178.8 lb

## 2022-12-05 DIAGNOSIS — J302 Other seasonal allergic rhinitis: Secondary | ICD-10-CM | POA: Diagnosis not present

## 2022-12-05 DIAGNOSIS — H66001 Acute suppurative otitis media without spontaneous rupture of ear drum, right ear: Secondary | ICD-10-CM | POA: Diagnosis not present

## 2022-12-05 MED ORDER — CETIRIZINE HCL 10 MG PO TABS
10.0000 mg | ORAL_TABLET | Freq: Every day | ORAL | 0 refills | Status: DC
Start: 2022-12-05 — End: 2023-01-15

## 2022-12-05 NOTE — Progress Notes (Signed)
Careteam: Patient Care Team: Ronald Alanis, NP as PCP - General (Adult Health Nurse Practitioner) [name], Lavell Islam, MD as PCP - Infectious Diseases (Infectious Diseases)  Seen by: Windell Moulding, AGNP-C  PLACE OF SERVICE:  [place] Directive information Does Patient Have a Medical Advance Directive?: No, Would patient like information on creating a medical advance directive?: No - Patient declined  Allergies  Allergen Reactions   Ibuprofen Other (See Comments)    BLISTERS    Chief Complaint  Patient presents with   Transitions Of Care    [facility] 12/03/2022 for Right ear pain.     HPI: Patient is a 47 y.o. male seen today for f/u ED visit 04/01 due to right ear pain.   04/02 he presented to the ED due to ongoing right ear pain x 4 days. Right TM bulging on exam. Respiratory panel negative. He was discharged with prescription for amoxicillin x 7 days.   Today he reports improved right ear pain. Right ear seems full with dull hearing. He is also having some nasal congestion. He is taking amoxicillin as prescribed. Afebrile. Vitals stable.    Review of Systems:  Review of Systems  Constitutional:  Positive for malaise/fatigue. Negative for chills and fever.  HENT:  Positive for congestion, ear pain and hearing loss. Negative for ear discharge, sinus pain and sore throat.   Respiratory:  Negative for cough, shortness of breath and wheezing.   Cardiovascular:  Negative for chest pain.  Gastrointestinal:  Negative for nausea and vomiting.  Musculoskeletal:  Negative for myalgias.  Neurological:  Negative for dizziness and headaches.  Endo/Heme/Allergies:  Positive for environmental allergies.  Psychiatric/Behavioral:  Negative for depression. The patient is not nervous/anxious.     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 He has a 7.20 pack-year smoking history. He has never used smokeless tobacco. He reports that he does not currently use alcohol. He reports that he does not use drugs.  Family History  Problem Relation Age of Onset   Hypertension Mother    Cancer Father    Diabetes Maternal Aunt    Seizures Paternal Aunt    Colon cancer Neg Hx    Colon polyps Neg Hx    Esophageal cancer Neg Hx    Rectal cancer Neg Hx    Stomach cancer Neg Hx     Medications: Patient's Medications  New Prescriptions   No medications on file  Previous Medications   AMLODIPINE (NORVASC) 10 MG TABLET    Take 1 tablet (10 mg total) by mouth daily.   AMOXICILLIN (AMOXIL) 500 MG CAPSULE    Take 1 capsule (500 mg total) by mouth 3 (three) times daily.   BICTEGRAVIR-EMTRICITABINE-TENOFOVIR AF (BIKTARVY) 50-200-25 MG TABS TABLET    Take 1  tablet by mouth daily.   LOSARTAN-HYDROCHLOROTHIAZIDE (HYZAAR) 50-12.5 MG TABLET    Take 1 tablet by mouth daily.  Modified Medications   No medications on file  Discontinued Medications   No medications on file    Physical Exam:  Vitals:   12/05/22 1020  BP: 118/88  Pulse: 84  Resp: 16  Temp: 98.9 F (37.2 C)  SpO2: 97%  Weight: 178 lb 12.8 oz (81.1 kg)  Height: 5\' 6"  (1.676 m)   Body mass index is 28.86 kg/m. Wt Readings from Last 3 Encounters:  12/05/22 178 lb 12.8 oz (81.1 kg)  12/03/22 187 lb (84.8 kg)  10/31/22 180 lb (81.6 kg)    Physical  Exam Vitals reviewed.  Constitutional:      General: He is not in acute distress. HENT:     Head: Normocephalic.     Right Ear: Tympanic membrane is bulging.     Left Ear: Tympanic membrane is not bulging.     Nose: Congestion present.     Right Turbinates: Enlarged.     Left Turbinates: Enlarged.     Right Sinus: No maxillary sinus tenderness or frontal sinus tenderness.     Left Sinus: No maxillary sinus tenderness or frontal sinus tenderness.     Mouth/Throat:     Pharynx: No oropharyngeal exudate or posterior oropharyngeal erythema.  Eyes:     General:        Right eye: No discharge.        Left eye: No discharge.  Cardiovascular:     Rate and Rhythm: Normal rate and regular rhythm.     Pulses: Normal pulses.     Heart sounds: Normal heart sounds.  Pulmonary:     Effort: Pulmonary effort is normal.     Breath sounds: Normal breath sounds.  Musculoskeletal:     Cervical back: Neck supple.  Lymphadenopathy:     Cervical: No cervical adenopathy.  Skin:    General: Skin is warm.     Capillary Refill: Capillary refill takes less than 2 seconds.  Neurological:     General: No focal deficit present.     Mental Status: He is oriented to person, place, and time.  Psychiatric:        Mood and Affect: Mood normal.        Behavior: Behavior normal.     Labs reviewed: Basic Metabolic Panel: Recent Labs    06/06/22 1005  NA 139  K 4.0  CL 103  CO2 26  GLUCOSE 99  BUN 16  CREATININE 1.44*  CALCIUM 9.8   Liver Function Tests: Recent Labs    06/06/22 1005  AST 26  ALT 20  BILITOT 0.6  PROT 7.6   No results for input(s): "LIPASE", "AMYLASE" in the last 8760 hours. No results for input(s): "AMMONIA" in the last 8760 hours. CBC: No results for input(s): "WBC", "NEUTROABS", "HGB", "HCT", "MCV", "PLT" in the last 8760 hours. Lipid Panel: Recent Labs    06/06/22 1005 09/26/22 0958  CHOL 202* 159  HDL 55 48  LDLCALC 132* 97  TRIG 61 57  CHOLHDL 3.7 3.3    TSH: No results for input(s): "TSH" in the last 8760 hours. A1C: Lab Results  Component Value Date   HGBA1C 4.5 09/06/2019     Assessment/Plan 1. Non-recurrent acute suppurative otitis media of right ear without spontaneous rupture of tympanic membrane - 04/02 ED visit> right otitis media> prescribed amoxicillin x 7 days - ear pain improved today - having  ear fullness/ dull hearing, clear nasal congestion - nasal turbinates swollen, right TM bulging - suspect allergies  - start zyrtec 10 mg po qhs   2. Seasonal allergies - see above  Total time: 22 minutes. Greater than 50% of total time spent doing patient education regarding health maintenance, right ear pain and allergies including symptom/medication management.     Next appt: none Rachel Rison [place], [facility] [phone]

## 2022-12-05 NOTE — Patient Instructions (Addendum)
Try Zyrtec> one tablet every night > will help you breath and hear better  Finish antibiotic  Try heating pad or hot shower for ear pain  May also use tylenol for ear pain

## 2022-12-16 ENCOUNTER — Other Ambulatory Visit: Payer: Self-pay

## 2022-12-16 ENCOUNTER — Ambulatory Visit: Payer: 59 | Admitting: Family

## 2022-12-26 ENCOUNTER — Encounter: Payer: 59 | Admitting: Orthopedic Surgery

## 2022-12-27 NOTE — Progress Notes (Signed)
This encounter was created in error - please disregard.

## 2023-01-14 ENCOUNTER — Other Ambulatory Visit: Payer: Self-pay

## 2023-01-14 ENCOUNTER — Ambulatory Visit (INDEPENDENT_AMBULATORY_CARE_PROVIDER_SITE_OTHER): Payer: 59 | Admitting: Family

## 2023-01-14 ENCOUNTER — Other Ambulatory Visit (HOSPITAL_COMMUNITY): Payer: Self-pay

## 2023-01-14 ENCOUNTER — Telehealth: Payer: Self-pay

## 2023-01-14 ENCOUNTER — Encounter: Payer: Self-pay | Admitting: Family

## 2023-01-14 VITALS — BP 137/99 | HR 107 | Temp 98.4°F | Resp 16 | Wt 173.4 lb

## 2023-01-14 DIAGNOSIS — Z0000 Encounter for general adult medical examination without abnormal findings: Secondary | ICD-10-CM

## 2023-01-14 DIAGNOSIS — B20 Human immunodeficiency virus [HIV] disease: Secondary | ICD-10-CM | POA: Diagnosis not present

## 2023-01-14 DIAGNOSIS — Z23 Encounter for immunization: Secondary | ICD-10-CM

## 2023-01-14 MED ORDER — BIKTARVY 50-200-25 MG PO TABS
1.0000 | ORAL_TABLET | Freq: Every day | ORAL | 5 refills | Status: DC
  Filled 2023-01-14 – 2023-01-16 (×3): qty 30, 30d supply, fill #0
  Filled 2023-02-13: qty 30, 30d supply, fill #1
  Filled 2023-03-12 – 2023-03-18 (×2): qty 30, 30d supply, fill #2
  Filled 2023-04-15: qty 30, 30d supply, fill #3
  Filled 2023-05-09: qty 30, 30d supply, fill #4
  Filled 2023-06-09: qty 30, 30d supply, fill #5

## 2023-01-14 NOTE — Assessment & Plan Note (Signed)
Ronald [name] continues to have well controlled virus with good adherence and tolerance to [employer]. Reviewed previous lab work and discussed U=U and plan of care. Check lab work. Continue current dose of Biktarvy. Plan for follow up in 6 months or sooner if needed with lab work on the same day.

## 2023-01-14 NOTE — Patient Instructions (Signed)
Nice to see you. ? ?We will check your lab work today. ? ?Continue to take your medication daily as prescribed. ? ?Refills have been sent to the pharmacy. ? ?Plan for follow up in 6 months or sooner if needed with lab work on the same day. ? ?Have a great day and stay safe! ? ?

## 2023-01-14 NOTE — Progress Notes (Unsigned)
Brief Narrative   Patient ID: Ronald [name], male    DOB: April 18, 1976, 47 y.o.   MRN: [id number]  Ronald [name] is a 47 y/o AA male diagnosed with HIV disease in 2011 with risk factor of MSM. Initial lab work on 07/13/10 with viral load of 164,000 and CD4 count 50. Genosure was unable to be completed. [id number] negative. No history of opportunistic infection. Entered care at [facility] Stage 3. ART experience with raltegravir, emtricitibine/TDF, and Biktarvy.   Subjective:    Chief Complaint  Patient presents with   Follow-up    b20    HPI:  Ronald [name] is a 47 y.o. male with HIV disease last seen on 06/07/22 with well controlled virus and good adherence and tolerance to [employer]. Viral load was undetectable and CD4 count 315. Renal function, hepatic function and electrolytes within normal ranges. Here today for routine follow up.  Ronald [name] has been doing well since last office visit and continues to take Biktarvy with no adverse side effects. Has a few issues getting medication from the pharmacy which hopefully have been resolved. No new concerns/complaints. Condoms and STD testing offered. Due for routine dental care and Prevnar 20 vaccination.   Denies fevers, chills, night sweats, headaches, changes in vision, neck pain/stiffness, nausea, diarrhea, vomiting, lesions or rashes.  Allergies  Allergen Reactions   Ibuprofen Other (See Comments)    BLISTERS      Outpatient Medications Prior to Visit  Medication Sig Dispense Refill   amLODipine (NORVASC) 10 MG tablet Take 1 tablet (10 mg total) by mouth daily. 90 tablet 2   losartan-hydrochlorothiazide (HYZAAR) 50-12.5 MG tablet Take 1 tablet by mouth daily. 90 tablet 2   bictegravir-emtricitabine-tenofovir AF (BIKTARVY) 50-200-25 MG TABS tablet Take 1 tablet by mouth daily. 30 tablet 5   amoxicillin (AMOXIL) 500 MG capsule Take 1 capsule (500 mg total) by mouth 3 (three) times daily. (Patient not taking: Reported on 01/14/2023) 21 capsule 0    cetirizine (ZYRTEC) 10 MG tablet Take 1 tablet (10 mg total) by mouth at bedtime. (Patient not taking: Reported on 01/14/2023) 30 tablet 0   No facility-administered medications prior to visit.     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   Grieving 12/05/2020   HIV infection (HCC)    Rash 08/03/2018   Tinea corporis 08/03/2018   Tinea versicolor 08/03/2018     Past Surgical History:  Procedure Laterality Date   EXAMINATION UNDER ANESTHESIA  12/27/2011   Procedure: EXAM UNDER ANESTHESIA;  Surgeon: Shelly Rubenstein, MD;  Location: [facility];  Service: General;  Laterality: N/A;   NO PAST SURGERIES     WART FULGURATION  12/27/2011   Procedure: FULGURATION ANAL WART;  Surgeon: Shelly Rubenstein, MD;  Location:  SUR[facility]ERY [facility];  Service: General;  Laterality: N/A;  Excision anal condyloma      Review of Systems  Constitutional:  Negative for appetite change, chills, fatigue, fever and unexpected weight change.  Eyes:  Negative for visual disturbance.  Respiratory:  Negative for cough, chest tightness, shortness of breath and wheezing.   Cardiovascular:  Negative for chest pain and leg swelling.  Gastrointestinal:  Negative for abdominal pain, constipation, diarrhea, nausea and vomiting.  Genitourinary:  Negative for dysuria, flank pain, frequency, genital sores, hematuria and urgency.  Skin:  Negative for rash.  Allergic/Immunologic: Negative for immunocompromised state.  Neurological:  Negative for dizziness and headaches.      Objective:  BP (!) 144/102   Pulse (!) 107   Temp 98.4 F (36.9 C) (Oral)   Resp 16   Wt 173 lb 6.4 oz (78.7 kg)   SpO2 98%   BMI 27.99 kg/m  Nursing note and vital signs reviewed.  Physical Exam Constitutional:      General: He is not in acute distress.    Appearance: He is well-developed.  Eyes:     Conjunctiva/sclera: Conjunctivae normal.  Cardiovascular:     Rate and Rhythm: Normal rate  and regular rhythm.     Heart sounds: Normal heart sounds. No murmur heard.    No friction rub. No gallop.  Pulmonary:     Effort: Pulmonary effort is normal. No respiratory distress.     Breath sounds: Normal breath sounds. No wheezing or rales.  Chest:     Chest wall: No tenderness.  Abdominal:     General: Bowel sounds are normal.     Palpations: Abdomen is soft.     Tenderness: There is no abdominal tenderness.  Musculoskeletal:     Cervical back: Neck supple.  Lymphadenopathy:     Cervical: No cervical adenopathy.  Skin:    General: Skin is warm and dry.     Findings: No rash.  Neurological:     Mental Status: He is alert and oriented to person, place, and time.  Psychiatric:        Behavior: Behavior normal.        Thought Content: Thought content normal.        Judgment: Judgment normal.         01/14/2023    2:29 PM 10/31/2022    3:04 PM 09/26/2022    9:33 AM 06/07/2022    8:42 AM 06/06/2022    9:10 AM  Depression screen PHQ 2/9  Decreased Interest 0 0 0 0 0  Down, Depressed, Hopeless 0 0 0 0 0  PHQ - 2 Score 0 0 0 0 0       Assessment & Plan:    Patient Active Problem List   Diagnosis Date Noted   Healthcare maintenance 06/07/2022   Grieving 12/05/2020   Tinea corporis 08/03/2018   Rash 08/03/2018   Tinea versicolor 08/03/2018   Routine general medical examination at a health care facility 10/23/2011   Blisters of multiple sites 04/09/2011   Dermatitis, seborrheic 04/09/2011   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 Reviewed  previous lab work and discussed U=U and plan of care. Check lab work. Continue current dose of Biktarvy. Plan for follow up in 6 months or sooner if needed with lab work on the same day.       Relevant Medications   bictegravir-emtricitabine-tenofovir AF (BIKTARVY) 50-200-25 MG TABS tablet   Other Relevant Orders   BASIC METABOLIC PANEL WITH GFR   HIV-1 RNA quant-no reflex-bld   T-helper cell (CD4)- ([facility] clinic only)   Healthcare maintenance    Discussed importance of safe sexual practice and condom use. Condoms and STD testing offered.  Prevnar 20 updated. Encouraged to complete routine dental  care and can refer to [facility] if needed. Due for colon cancer screening and will contact GI.       Other Visit Diagnoses     Need for pneumococcal 20-valent conjugate vaccination       Relevant Orders   Pneumococcal conjugate vaccine 20-valent (Prevnar-20) (Completed)        I am having Marylou Flesher maintain his amLODipine, losartan-hydrochlorothiazide, amoxicillin, cetirizine, and Biktarvy.   Meds ordered this encounter  Medications   bictegravir-emtricitabine-tenofovir AF (BIKTARVY) 50-200-25 MG TABS tablet    Sig: Take 1 tablet by mouth daily.    Dispense:  30 tablet    Refill:  5    .    Order Specific Question:   Supervising Provider    Answer:   Judyann Munson [[id number]]     Follow-up: Return in about 6 months (around 07/17/2023), or if symptoms worsen or fail to improve.   Marcos Eke, MSN, FNP-C Nurse Practitioner [facility] for Infectious Disease [facility] Main number: [phone]

## 2023-01-14 NOTE — Assessment & Plan Note (Signed)
Discussed importance of safe sexual practice and condom use. Condoms and STD testing offered.  Prevnar 20 updated. Encouraged to complete routine dental care and can refer to [facility] if needed. Due for colon cancer screening and will contact GI.

## 2023-01-14 NOTE — Telephone Encounter (Signed)
[facility] Patient Advocate Encounter   Received notification from [email or web address] that prior authorization for Ronald [name] is required. (Cost exceed)   PA submitted on 01/14/23 Key [id number] Status is pending    [facility] will continue to follow.   [name], CPhT Specialty Pharmacy Patient [facility] for Infectious Disease Phone: [phone] Fax:  [phone]

## 2023-01-15 ENCOUNTER — Encounter: Payer: Self-pay | Admitting: Family

## 2023-01-15 ENCOUNTER — Other Ambulatory Visit (HOSPITAL_COMMUNITY): Payer: Self-pay

## 2023-01-15 LAB — T-HELPER CELL (CD4) - (RCID CLINIC ONLY)
CD4 % Helper T Cell: 26 % — ABNORMAL LOW (ref 33–65)
CD4 T Cell Abs: 341 /uL — ABNORMAL LOW (ref 400–1790)

## 2023-01-16 ENCOUNTER — Telehealth: Payer: Self-pay

## 2023-01-16 ENCOUNTER — Other Ambulatory Visit (HOSPITAL_COMMUNITY): Payer: Self-pay

## 2023-01-16 ENCOUNTER — Other Ambulatory Visit: Payer: Self-pay

## 2023-01-16 NOTE — Telephone Encounter (Signed)
[facility] Patient Advocate Encounter  Prior Authorization for Susanne Borders has been approved.    PA# [id number] Effective dates: 01/15/23 through 09/01/38  Patients co-pay is $0.00.   [facility] will continue to follow.  [name], CPhT Specialty Pharmacy Patient [facility] for Infectious Disease Phone: [phone] Fax:  [phone]

## 2023-01-18 LAB — BASIC METABOLIC PANEL WITH GFR
BUN/Creatinine Ratio: 11 (calc) (ref 6–22)
BUN: 15 mg/dL (ref 7–25)
CO2: 30 mmol/L (ref 20–32)
Calcium: 9.8 mg/dL (ref 8.6–10.3)
Chloride: 98 mmol/L (ref 98–110)
Creat: 1.41 mg/dL — ABNORMAL HIGH (ref 0.60–1.29)
Glucose, Bld: 96 mg/dL (ref 65–99)
Potassium: 3.7 mmol/L (ref 3.5–5.3)
Sodium: 138 mmol/L (ref 135–146)
eGFR: 63 mL/min/{1.73_m2} (ref >=60)

## 2023-01-18 LAB — HIV-1 RNA QUANT-NO REFLEX-BLD
HIV 1 RNA Quant: 29 Copies/mL — ABNORMAL HIGH
HIV-1 RNA Quant, Log: 1.46 Log cps/mL — ABNORMAL HIGH

## 2023-02-06 ENCOUNTER — Other Ambulatory Visit (HOSPITAL_COMMUNITY): Payer: Self-pay

## 2023-02-10 ENCOUNTER — Other Ambulatory Visit (HOSPITAL_COMMUNITY): Payer: Self-pay

## 2023-02-12 ENCOUNTER — Other Ambulatory Visit (HOSPITAL_COMMUNITY): Payer: Self-pay

## 2023-02-12 ENCOUNTER — Encounter (HOSPITAL_COMMUNITY): Payer: Self-pay

## 2023-02-13 ENCOUNTER — Other Ambulatory Visit (HOSPITAL_COMMUNITY): Payer: Self-pay

## 2023-02-14 ENCOUNTER — Other Ambulatory Visit (HOSPITAL_COMMUNITY): Payer: Self-pay

## 2023-03-12 ENCOUNTER — Other Ambulatory Visit (HOSPITAL_COMMUNITY): Payer: Self-pay

## 2023-03-12 ENCOUNTER — Other Ambulatory Visit: Payer: Self-pay

## 2023-03-17 ENCOUNTER — Other Ambulatory Visit (HOSPITAL_COMMUNITY): Payer: Self-pay

## 2023-03-17 ENCOUNTER — Encounter (HOSPITAL_COMMUNITY): Payer: Self-pay

## 2023-03-18 ENCOUNTER — Other Ambulatory Visit (HOSPITAL_COMMUNITY): Payer: Self-pay

## 2023-03-18 ENCOUNTER — Other Ambulatory Visit: Payer: Self-pay

## 2023-03-19 ENCOUNTER — Other Ambulatory Visit (HOSPITAL_COMMUNITY): Payer: Self-pay

## 2023-03-27 ENCOUNTER — Encounter: Payer: 59 | Admitting: Orthopedic Surgery

## 2023-03-28 NOTE — Progress Notes (Signed)
This encounter was created in error - please disregard.

## 2023-04-10 ENCOUNTER — Encounter: Payer: Self-pay | Admitting: Orthopedic Surgery

## 2023-04-10 ENCOUNTER — Ambulatory Visit: Payer: 59 | Admitting: Orthopedic Surgery

## 2023-04-10 ENCOUNTER — Other Ambulatory Visit (HOSPITAL_COMMUNITY): Payer: Self-pay

## 2023-04-10 VITALS — BP 122/82 | HR 91 | Temp 98.5°F | Resp 16 | Ht 66.0 in | Wt 179.2 lb

## 2023-04-10 DIAGNOSIS — E7800 Pure hypercholesterolemia, unspecified: Secondary | ICD-10-CM | POA: Diagnosis not present

## 2023-04-10 DIAGNOSIS — I10 Essential (primary) hypertension: Secondary | ICD-10-CM | POA: Diagnosis not present

## 2023-04-10 DIAGNOSIS — B20 Human immunodeficiency virus [HIV] disease: Secondary | ICD-10-CM | POA: Diagnosis not present

## 2023-04-10 DIAGNOSIS — Z720 Tobacco use: Secondary | ICD-10-CM

## 2023-04-10 NOTE — Progress Notes (Signed)
Careteam: Patient Care Team: Octavia Heir, NP as PCP - General (Adult Health Nurse Practitioner) Daiva Eves, Lisette Grinder, MD as PCP - Infectious Diseases (Infectious Diseases)  Seen by: Hazle Nordmann, AGNP-C  PLACE OF SERVICE:  [facility]  Advanced Directive information Does Patient Have a Medical Advance Directive?: No, Would patient like information on creating a medical advance directive?: No - Patient declined  Allergies  Allergen Reactions   Ibuprofen Other (See Comments)    BLISTERS    Chief Complaint  Patient presents with   Medical Management of Chronic Issues    6 month follow up.    Immunizations    Discuss the need for DTAP vaccine, Covid Booster, and Influenza vaccine. NCIR Verified.    Health Maintenance    Discuss the need for Colonoscopy.      HPI: Patient is a 47 y.o. male seen today for medical management of chronic conditions.   No health concerns.   Colonoscopy scheduled 08/29.   Not smoking daily, but will have a couple cigarettes weekly. Discussed trying nicorette gum.   Initial bp elevated, rechecked normal. He is taking amlodipine and losartan-hydrochlorothiazide as prescribed.   Followed by ID for HIV. Remains on Biktarvy.   Plan to recheck lipid panel next visit.            Review of Systems:  Review of Systems  Constitutional: Negative.   HENT: Negative.    Respiratory: Negative.    Cardiovascular: Negative.   Gastrointestinal: Negative.   Genitourinary: Negative.   Musculoskeletal: Negative.   Skin: Negative.   Neurological: Negative.   Psychiatric/Behavioral: Negative.      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   Grieving 12/05/2020   HIV infection (HCC)    Rash 08/03/2018   Tinea corporis 08/03/2018   Tinea versicolor 08/03/2018   Past Surgical History:  Procedure Laterality Date   EXAMINATION UNDER ANESTHESIA  12/27/2011   Procedure: EXAM UNDER ANESTHESIA;  Surgeon: Shelly Rubenstein, MD;   Location: [facility];  Service: General;  Laterality: N/A;   NO PAST SURGERIES     WART FULGURATION  12/27/2011   Procedure: FULGURATION ANAL WART;  Surgeon: Shelly Rubenstein, MD;  Location: [facility];  Service: General;  Laterality: N/A;  Excision anal condyloma   Social History:   reports that he has been smoking cigarettes. He has a 7.2 pack-year smoking history. He has never used smokeless tobacco. He reports that he does not currently use alcohol. He reports that he does not use drugs.  Family History  Problem Relation Age of Onset   Hypertension Mother    Cancer Father    Diabetes Maternal Aunt    Seizures Paternal Aunt    Colon cancer Neg Hx    Colon polyps Neg Hx    Esophageal cancer Neg Hx    Rectal cancer Neg Hx    Stomach cancer Neg Hx     Medications: Patient's Medications  New Prescriptions   No medications on file  Previous Medications   AMLODIPINE (NORVASC) 10 MG TABLET    Take 1 tablet (10 mg total) by mouth daily.   BICTEGRAVIR-EMTRICITABINE-TENOFOVIR AF (BIKTARVY) 50-200-25 MG TABS TABLET    Take 1 tablet by mouth daily.   LOSARTAN-HYDROCHLOROTHIAZIDE (HYZAAR) 50-12.5 MG TABLET    Take 1 tablet by mouth daily.  Modified Medications   No medications on file  Discontinued Medications   No medications on file  Physical Exam:  Vitals:   04/10/23 0934 04/10/23 0935  BP: (!) 138/94 (!) 130/98  Pulse: 91   Resp: 16   Temp: 98.5 F (36.9 C)   SpO2: 96%   Weight: 179 lb 3.2 oz (81.3 kg)   Height: 5\' 6"  (1.676 m)    Body mass index is 28.92 kg/m. Wt Readings from Last 3 Encounters:  04/10/23 179 lb 3.2 oz (81.3 kg)  01/14/23 173 lb 6.4 oz (78.7 kg)  12/05/22 178 lb 12.8 oz (81.1 kg)    Physical Exam Vitals reviewed.  Constitutional:      General: He is not in acute distress. HENT:     Head: Normocephalic.  Eyes:     General:        Right eye: No discharge.        Left eye: No discharge.  Cardiovascular:      Rate and Rhythm: Normal rate and regular rhythm.     Pulses: Normal pulses.     Heart sounds: Normal heart sounds.  Pulmonary:     Effort: Pulmonary effort is normal. No respiratory distress.     Breath sounds: Normal breath sounds. No wheezing.  Abdominal:     General: Bowel sounds are normal.     Palpations: Abdomen is soft.  Musculoskeletal:     Cervical back: Neck supple.     Right lower leg: No edema.     Left lower leg: No edema.  Skin:    General: Skin is warm.     Capillary Refill: Capillary refill takes less than 2 seconds.  Neurological:     General: No focal deficit present.     Mental Status: He is alert and oriented to person, place, and time.  Psychiatric:        Mood and Affect: Mood normal.     Labs reviewed: Basic Metabolic Panel: Recent Labs    06/06/22 1005 01/14/23 0316  NA 139 138  K 4.0 3.7  CL 103 98  CO2 26 30  GLUCOSE 99 96  BUN 16 15  CREATININE 1.44* 1.41*  CALCIUM 9.8 9.8   Liver Function Tests: Recent Labs    06/06/22 1005  AST 26  ALT 20  BILITOT 0.6  PROT 7.6   No results for input(s): "LIPASE", "AMYLASE" in the last 8760 hours. No results for input(s): "AMMONIA" in the last 8760 hours. CBC: No results for input(s): "WBC", "NEUTROABS", "HGB", "HCT", "MCV", "PLT" in the last 8760 hours. Lipid Panel: Recent Labs    06/06/22 1005 09/26/22 0958  CHOL 202* 159  HDL 55 48  LDLCALC 132* 97  TRIG 61 57  CHOLHDL 3.7 3.3   TSH: No results for input(s): "TSH" in the last 8760 hours. A1C: Lab Results  Component Value Date   HGBA1C 4.5 09/06/2019     Assessment/Plan 1. Essential hypertension - controlled, goal < 130/80 - BUN/creat 15/1.41 - cont to limit sodium in diet - cont amlodipine and losartan-HCTZ  2. Hypercholesteremia - total 159, LDL 97 - recheck next visit - cont to limit processed and fried foods in diet - Lipid Panel; Future  3. HIV disease (HCC) - followed by ID - cont Biktarvy  4. Tobacco  abuse - ongoing - does not smoke daily, but has a few on weekends - discussed trying nicorette gum to quit  Total time: 31 minutes. Greater than 50% of total time spent doing patient education regarding health maintenance, HTN, HLD, HIV and smoking cessation including symptom/medication management.  Next appt: Visit date not found   Scherry Ran  [facility] [phone]

## 2023-04-10 NOTE — Patient Instructions (Addendum)
Recommend flu vaccine BEFORE Nov 1st  Next visit please come fasting so we can check cholesterol> water or black coffee only!  Try to quit those last few cigarettes> maybe nicorette gum when you want a cigarette

## 2023-04-11 ENCOUNTER — Ambulatory Visit (AMBULATORY_SURGERY_CENTER): Payer: 59 | Admitting: *Deleted

## 2023-04-11 VITALS — Ht 66.0 in | Wt 176.0 lb

## 2023-04-11 DIAGNOSIS — Z1211 Encounter for screening for malignant neoplasm of colon: Secondary | ICD-10-CM

## 2023-04-11 MED ORDER — NA SULFATE-K SULFATE-MG SULF 17.5-3.13-1.6 GM/177ML PO SOLN
1.0000 | Freq: Once | ORAL | 0 refills | Status: AC
Start: 2023-04-11 — End: 2023-04-11

## 2023-04-11 NOTE — Progress Notes (Signed)
Pt's name and DOB verified at the beginning of the pre-visit.  Pt denies any difficulty with ambulating,sitting, laying down or rolling side to side Gave both [facility] main # and MD on call # prior to instructions.  No egg or soy allergy known to patient  No issues known to pt with past sedation with any surgeries or procedures Pt denies having issues being intubated Pt has no issues moving head neck or swallowing No FH of Malignant Hyperthermia Pt is not on diet pills Pt is not on home 02  Pt is not on blood thinners  Pt denies issues with constipation  Pt is not on dialysis Pt denise any abnormal heart rhythms  Pt denies any upcoming cardiac testing Pt encouraged to use to use Singlecare or Goodrx to reduce cost  Patient's chart reviewed by Cathlyn Parsons CNRA prior to pre-visit and patient appropriate for the [facility].  Pre-visit completed and red dot placed by patient's name on their procedure day (on provider's schedule).  . Visit in person Pt states weight is 176 lb Instructed pt why it is important to and  to call if they have any changes in health or new medications. Directed them to the # given and on instructions.   Pt states they will.  Instructions reviewed with pt and pt states understanding. Instructed to review again prior to procedure. Pt states they will.  Instructions given to pt with coupon and by my chart

## 2023-04-14 ENCOUNTER — Other Ambulatory Visit (HOSPITAL_COMMUNITY): Payer: Self-pay

## 2023-04-15 ENCOUNTER — Other Ambulatory Visit: Payer: Self-pay

## 2023-04-16 ENCOUNTER — Other Ambulatory Visit (HOSPITAL_COMMUNITY): Payer: Self-pay

## 2023-04-22 ENCOUNTER — Encounter: Payer: Self-pay | Admitting: Gastroenterology

## 2023-05-02 ENCOUNTER — Ambulatory Visit (AMBULATORY_SURGERY_CENTER): Payer: 59 | Admitting: Gastroenterology

## 2023-05-02 ENCOUNTER — Encounter: Payer: Self-pay | Admitting: Gastroenterology

## 2023-05-02 VITALS — BP 126/80 | HR 69 | Temp 97.8°F | Resp 18 | Ht 66.0 in | Wt 176.0 lb

## 2023-05-02 DIAGNOSIS — Z1211 Encounter for screening for malignant neoplasm of colon: Secondary | ICD-10-CM | POA: Diagnosis present

## 2023-05-02 MED ORDER — SODIUM CHLORIDE 0.9 % IV SOLN: 500.0000 mL | Freq: Once | INTRAVENOUS | Status: DC

## 2023-05-02 NOTE — Op Note (Signed)
[facility] Patient Name: Ronald [name] Procedure Date: 05/02/2023 10:30 AM MRN: [id number] Endoscopist: Lorin Picket E. Tomasa [name] , MD, [id number] Age: 47 Referring MD:  Date of Birth: 10/24/1975 Gender: Male Account #: [id number] Procedure:                Colonoscopy Indications:              Screening for colorectal malignant neoplasm, This                            is the patient's first colonoscopy Medicines:                Monitored Anesthesia Care Procedure:                Pre-Anesthesia Assessment:                           - Prior to the procedure, a History and Physical                            was performed, and patient medications and                            allergies were reviewed. The patient's tolerance of                            previous anesthesia was also reviewed. The risks                            and benefits of the procedure and the sedation                            options and risks were discussed with the patient.                            All questions were answered, and informed consent                            was obtained. Prior Anticoagulants: The patient has                            taken no anticoagulant or antiplatelet agents. ASA                            Grade Assessment: III - A patient with severe                            systemic disease. After reviewing the risks and                            benefits, the patient was deemed in satisfactory                            condition to undergo the procedure.  After obtaining informed consent, the colonoscope                            was passed under direct vision. Throughout the                            procedure, the patient's blood pressure, pulse, and                            oxygen saturations were monitored continuously. The                            CF [id number] #[id number] was introduced through the anus                            and advanced  to the the terminal ileum, with                            identification of the appendiceal orifice and IC                            valve. The colonoscopy was performed without                            difficulty. The patient tolerated the procedure                            well. The quality of the bowel preparation was                            good. The terminal ileum, ileocecal valve,                            appendiceal orifice, and rectum were photographed.                            The bowel preparation used was SUPREP via split                            dose instruction. Scope In: 10:38:49 AM Scope Out: 10:49:21 AM Scope Withdrawal Time: 0 hours 7 minutes 42 seconds  Total Procedure Duration: 0 hours 10 minutes 32 seconds  Findings:                 The perianal exam findings include scarring,                            hypopigmentation of the perianal skin.                           The digital rectal exam was normal. Pertinent                            negatives include normal sphincter tone and no  palpable rectal lesions.                           A few medium-mouthed diverticula were found in the                            ascending colon.                           The exam was otherwise normal throughout the                            examined colon.                           The terminal ileum appeared normal.                           The retroflexed view of the distal rectum and anal                            verge was normal and showed no anal or rectal                            abnormalities. Complications:            No immediate complications. Estimated Blood Loss:     Estimated blood loss: none. Impression:               - Scarring, hypopigmentation found on perianal                            exam, consistent with history of condyloma s/p                            fulguration.                           - Mild diverticulosis  in the ascending colon.                           - The examined portion of the ileum was normal.                           - The distal rectum and anal verge are normal on                            retroflexion view.                           - No specimens collected. Recommendation:           - Patient has a contact number available for                            emergencies. The signs and symptoms of potential  delayed complications were discussed with the                            patient. Return to normal activities tomorrow.                            Written discharge instructions were provided to the                            patient.                           - Resume previous diet.                           - Continue present medications.                           - Repeat colonoscopy in 10 years for screening                            purposes. Ronald Martha E. Tomasa Rand, MD 05/02/2023 10:56:33 AM This report has been signed electronically.

## 2023-05-02 NOTE — Progress Notes (Signed)
Report to PACU, RN, vss, BBS= Clear.  

## 2023-05-02 NOTE — Progress Notes (Signed)
Pt's states no medical or surgical changes since previsit or office visit. 

## 2023-05-02 NOTE — Patient Instructions (Addendum)
Continue present medications. Repeat colonoscopy in 10 years for screening purposes.  Please read over handouts about diverticulosis                            YOU HAD AN ENDOSCOPIC PROCEDURE TODAY AT THE [facility]:   Refer to the procedure report that was given to you for any specific questions about what was found during the examination.  If the procedure report does not answer your questions, please call your gastroenterologist to clarify.  If you requested that your care partner not be given the details of your procedure findings, then the procedure report has been included in a sealed envelope for you to review at your convenience later.  YOU SHOULD EXPECT: Some feelings of bloating in the abdomen. Passage of more gas than usual.  Walking can help get rid of the air that was put into your GI tract during the procedure and reduce the bloating. If you had a lower endoscopy (such as a colonoscopy or flexible sigmoidoscopy) you may notice spotting of blood in your stool or on the toilet paper. If you underwent a bowel prep for your procedure, you may not have a normal bowel movement for a few days.  Please Note:  You might notice some irritation and congestion in your nose or some drainage.  This is from the oxygen used during your procedure.  There is no need for concern and it should clear up in a day or so.  SYMPTOMS TO REPORT IMMEDIATELY:  Following lower endoscopy (colonoscopy or flexible sigmoidoscopy):  Excessive amounts of blood in the stool  Significant tenderness or worsening of abdominal pains  Swelling of the abdomen that is new, acute  Fever of 100F or higher  For urgent or emergent issues, a gastroenterologist can be reached at any hour by calling [phone]. Do not use MyChart messaging for urgent concerns.    DIET:  We do recommend a small meal at first, but then you may proceed to your regular diet.  Drink plenty of fluids but you should avoid alcoholic  beverages for 24 hours.  ACTIVITY:  You should plan to take it easy for the rest of today and you should NOT DRIVE or use heavy machinery until tomorrow (because of the sedation medicines used during the test).    FOLLOW UP: Our staff will call the number listed on your records the next business day following your procedure.  We will call around 7:15- 8:00 am to check on you and address any questions or concerns that you may have regarding the information given to you following your procedure. If we do not reach you, we will leave a message.     SIGNATURES/CONFIDENTIALITY: You and/or your care partner have signed paperwork which will be entered into your electronic medical record.  These signatures attest to the fact that that the information above on your After Visit Summary has been reviewed and is understood.  Full responsibility of the confidentiality of this discharge information lies with you and/or your care-partner.

## 2023-05-02 NOTE — Progress Notes (Signed)
[facility] History and Physical   Primary Care Physician:  Octavia Heir, NP   Reason for Procedure:   Colon cancer screening  Plan:    Screening colonoscopy     HPI: Ronald [name] is a 47 y.o. male undergoing initial average risk screening colonoscopy.  He has no family history of colon cancer and no chronic GI symptoms.    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   Grieving 12/05/2020   HIV infection (HCC)    Hypertension    Rash 08/03/2018   Tinea corporis 08/03/2018   Tinea versicolor 08/03/2018    Past Surgical History:  Procedure Laterality Date   EXAMINATION UNDER ANESTHESIA  12/27/2011   Procedure: EXAM UNDER ANESTHESIA;  Surgeon: Shelly Rubenstein, MD;  Location: [facility];  Service: General;  Laterality: N/A;   NO PAST SURGERIES     WART FULGURATION  12/27/2011   Procedure: FULGURATION ANAL WART;  Surgeon: Shelly Rubenstein, MD;  Location: [facility];  Service: General;  Laterality: N/A;  Excision anal condyloma    Prior to Admission medications   Medication Sig Start Date End Date Taking? Authorizing Provider  amLODipine (NORVASC) 10 MG tablet Take 1 tablet (10 mg total) by mouth daily. 09/26/22  Yes Fargo, Amy E, NP  bictegravir-emtricitabine-tenofovir AF (BIKTARVY) 50-200-25 MG TABS tablet Take 1 tablet by mouth daily. 01/14/23  Yes Veryl Speak, FNP  losartan-hydrochlorothiazide (HYZAAR) 50-12.5 MG tablet Take 1 tablet by mouth daily. 09/26/22  Yes Fargo, Amy E, NP  Multiple Vitamin (MULTIVITAMIN) tablet Take 1 tablet by mouth daily.   Yes [provider]    Current Outpatient Medications  Medication Sig Dispense Refill   amLODipine (NORVASC) 10 MG tablet Take 1 tablet (10 mg total) by mouth daily. 90 tablet 2   bictegravir-emtricitabine-tenofovir AF (BIKTARVY) 50-200-25 MG TABS tablet Take 1 tablet by mouth daily. 30 tablet 5   losartan-hydrochlorothiazide (HYZAAR) 50-12.5 MG  tablet Take 1 tablet by mouth daily. 90 tablet 2   Multiple Vitamin (MULTIVITAMIN) tablet Take 1 tablet by mouth daily.     Current Facility-Administered Medications  Medication Dose Route Frequency Provider Last Rate Last Admin   0.9 %  sodium chloride infusion  500 mL Intravenous Once Jenel Lucks, MD        Allergies as of 05/02/2023 - Review Complete 05/02/2023  Allergen Reaction Noted   Ibuprofen Other (See Comments) 10/23/2011    Family History  Problem Relation Age of Onset   Hypertension Mother    Cancer Father    Diabetes Maternal Aunt    Seizures Paternal Aunt    Colon cancer Neg Hx    Colon polyps Neg Hx    Esophageal cancer Neg Hx    Rectal cancer Neg Hx    Stomach cancer Neg Hx     Social History   Socioeconomic History   Marital status: Married    Spouse name: Not on file   Number of children: Not on file   Years of education: Not on file   Highest education level: GED or equivalent  Occupational History   Occupation: Emergency planning/management officer: [employer]  Tobacco Use   Smoking status: Every Day    Current packs/day: 0.30    Average packs/day: 0.3 packs/day for 24.0 years (7.2 ttl pk-yrs)    Types: Cigarettes   Smokeless tobacco: Never   Tobacco comments:    Goes through a pack in  3 days. Cutting back.  Vaping Use   Vaping status: Never Used  Substance and Sexual Activity   Alcohol use: Yes    Comment: rare   Drug use: No    Frequency: 1.0 times per week   Sexual activity: Not Currently    Comment: declined condoms  Other Topics Concern   Not on file  Social History Narrative   Lives with [place]   Social Determinants of Health   Financial Resource Strain: Low Risk  (04/08/2023)   Overall Financial Resource Strain (CARDIA)    Difficulty of Paying Living Expenses: Not hard at all  Food Insecurity: No Food Insecurity (04/08/2023)   Hunger Vital Sign    Worried About Running Out of Food in the Last Year: Never true    Ran Out of  Food in the Last Year: Never true  Transportation Needs: No Transportation Needs (04/08/2023)   PRAPARE - Administrator, Civil Service (Medical): No    Lack of Transportation (Non-Medical): No  Physical Activity: Sufficiently Active (04/08/2023)   Exercise Vital Sign    Days of Exercise per Week: 4 days    Minutes of Exercise per Session: 150+ min  Stress: No Stress Concern Present (04/08/2023)   [employer] of Occupational Health - Occupational Stress Questionnaire    Feeling of Stress : Not at all  Social Connections: Unknown (04/08/2023)   Social Connection and Isolation Panel [NHANES]    Frequency of Communication with Friends and Family: More than three times a week    Frequency of Social Gatherings with Friends and Family: Patient declined    Attends Religious Services: Patient declined    Database administrator or Organizations: No    Attends Engineer, structural: Not on file    Marital Status: Married  Intimate Partner Violence: Unknown (12/05/2021)   Received from [facility]   HITS    Physically Hurt: Not on file    Insult or Talk Down To: Not on file    Threaten Physical Harm: Not on file    Scream or Curse: Not on file    Review of Systems:  All other review of systems negative except as mentioned in the HPI.  Physical Exam: Vital signs BP (!) 131/94   Pulse 72   Temp 97.8 F (36.6 C) (Temporal)   Ht 5\' 6"  (1.676 m)   Wt 176 lb (79.8 kg)   SpO2 99%   BMI 28.41 kg/m   General:   Alert,  Well-developed, well-nourished, pleasant and cooperative in NAD Airway:  Mallampati 3 Lungs:  Clear throughout to auscultation.   Heart:  Regular rate and rhythm; no murmurs, clicks, rubs,  or gallops. Abdomen:  Soft, nontender and nondistended. Normal bowel sounds.   Neuro/Psych:  Normal mood and affect. A and O x 3   Robinn Overholt E. Tomasa Rand, MD [facility]

## 2023-05-06 ENCOUNTER — Telehealth: Payer: Self-pay | Admitting: *Deleted

## 2023-05-06 NOTE — Telephone Encounter (Signed)
Post procedure follow up call placed, no answer and left VM.  

## 2023-05-09 ENCOUNTER — Other Ambulatory Visit (HOSPITAL_COMMUNITY): Payer: Self-pay

## 2023-05-13 ENCOUNTER — Other Ambulatory Visit (HOSPITAL_COMMUNITY): Payer: Self-pay

## 2023-05-16 NOTE — Progress Notes (Signed)
The 10-year ASCVD risk score (Arnett DK, et al., 2019) is: 11.2%   Values used to calculate the score:     Age: 47 years     Sex: Male     Is Non-Hispanic African American: Yes     Diabetic: No     Tobacco smoker: Yes     Systolic Blood Pressure: 126 mmHg     Is BP treated: Yes     HDL Cholesterol: 48 mg/dL     Total Cholesterol: 159 mg/dL  Sandie Ano, RN

## 2023-06-05 ENCOUNTER — Other Ambulatory Visit: Payer: Self-pay

## 2023-06-09 ENCOUNTER — Other Ambulatory Visit (HOSPITAL_COMMUNITY): Payer: Self-pay

## 2023-06-09 ENCOUNTER — Other Ambulatory Visit: Payer: Self-pay

## 2023-06-09 NOTE — Progress Notes (Signed)
Specialty Pharmacy Refill Coordination Note  Ronald [name] is a 47 y.o. male contacted today regarding refills of specialty medication(s) Bictegravir-Emtricitab-Tenofov   Patient requested Delivery   Delivery date: 06/13/23   Verified address: [address], [address] [place] [address]   Medication will be filled on 06/12/23.

## 2023-06-12 ENCOUNTER — Other Ambulatory Visit: Payer: Self-pay

## 2023-06-20 ENCOUNTER — Other Ambulatory Visit: Payer: Self-pay

## 2023-06-20 DIAGNOSIS — Z113 Encounter for screening for infections with a predominantly sexual mode of transmission: Secondary | ICD-10-CM

## 2023-06-20 DIAGNOSIS — B20 Human immunodeficiency virus [HIV] disease: Secondary | ICD-10-CM

## 2023-06-24 ENCOUNTER — Other Ambulatory Visit: Payer: 59

## 2023-07-05 ENCOUNTER — Other Ambulatory Visit: Payer: Self-pay | Admitting: Orthopedic Surgery

## 2023-07-05 DIAGNOSIS — I10 Essential (primary) hypertension: Secondary | ICD-10-CM

## 2023-07-07 ENCOUNTER — Other Ambulatory Visit: Payer: Self-pay

## 2023-07-07 NOTE — Progress Notes (Signed)
error 

## 2023-07-08 ENCOUNTER — Ambulatory Visit: Payer: 59 | Admitting: Family

## 2023-07-11 ENCOUNTER — Encounter (HOSPITAL_COMMUNITY): Payer: Self-pay

## 2023-07-11 ENCOUNTER — Other Ambulatory Visit: Payer: Self-pay

## 2023-07-11 ENCOUNTER — Other Ambulatory Visit (HOSPITAL_COMMUNITY): Payer: Self-pay

## 2023-07-14 ENCOUNTER — Other Ambulatory Visit: Payer: Self-pay

## 2023-07-16 ENCOUNTER — Other Ambulatory Visit: Payer: Self-pay | Admitting: Family

## 2023-07-16 ENCOUNTER — Other Ambulatory Visit: Payer: Self-pay

## 2023-07-16 DIAGNOSIS — B20 Human immunodeficiency virus [HIV] disease: Secondary | ICD-10-CM

## 2023-07-16 MED ORDER — BIKTARVY 50-200-25 MG PO TABS
1.0000 | ORAL_TABLET | Freq: Every day | ORAL | 0 refills | Status: DC
Start: 2023-07-16 — End: 2023-09-09
  Filled 2023-07-16 – 2023-07-23 (×2): qty 30, 30d supply, fill #0

## 2023-07-23 ENCOUNTER — Other Ambulatory Visit: Payer: Self-pay

## 2023-07-23 NOTE — Progress Notes (Signed)
Specialty Pharmacy Ongoing Clinical Assessment Note  Ronald [name] is a 47 y.o. male who is being followed by the specialty pharmacy service for RxSp HIV   Patient's specialty medication(s) reviewed today: Bictegravir-Emtricitab-Tenofov   Missed doses in the last 4 weeks: 0   Patient/Caregiver did not have any additional questions or concerns.   Therapeutic benefit summary: Patient is achieving benefit   Adverse events/side effects summary: No adverse events/side effects   Patient's therapy is appropriate to: Continue    Goals Addressed             This Visit's Progress    Achieve Undetectable HIV Viral Load < 20       Patient is on track. Patient will maintain adherence         Follow up:  6 months  Bobette Mo Specialty Pharmacist

## 2023-07-23 NOTE — Progress Notes (Signed)
Specialty Pharmacy Refill Coordination Note  Ronald [name] is a 47 y.o. male contacted today regarding refills of specialty medication(s) Bictegravir-Emtricitab-Tenofov   Patient requested Delivery   Delivery date: 07/25/23   Verified address: [address] [place] [address]   Medication will be filled on 07/24/23.

## 2023-07-24 ENCOUNTER — Other Ambulatory Visit (HOSPITAL_COMMUNITY): Payer: Self-pay

## 2023-07-29 ENCOUNTER — Ambulatory Visit: Payer: 59 | Admitting: Family

## 2023-08-14 ENCOUNTER — Other Ambulatory Visit: Payer: Self-pay

## 2023-08-20 ENCOUNTER — Other Ambulatory Visit: Payer: Self-pay

## 2023-08-20 ENCOUNTER — Other Ambulatory Visit: Payer: Self-pay | Admitting: Family

## 2023-08-20 DIAGNOSIS — B20 Human immunodeficiency virus [HIV] disease: Secondary | ICD-10-CM

## 2023-08-20 NOTE — Progress Notes (Signed)
Specialty Pharmacy Refill Coordination Note  Ronald [name] is a 47 y.o. male contacted today regarding refills of specialty medication(s) Bictegravir-Emtricitab-Tenofov Susanne Borders)   Patient requested Delivery   Delivery date: 08/29/23   Verified address: [address], [address]   [place] [address]   Medication will be filled on 12.26.24.  Refill request pending. - advised patient that refill may be denied due to overdue office visit. Please notify patient if delayed.

## 2023-08-21 NOTE — Telephone Encounter (Signed)
Pt due for appt 

## 2023-08-25 ENCOUNTER — Telehealth: Payer: Self-pay

## 2023-08-25 NOTE — Telephone Encounter (Signed)
Called pt and left a VM in regards to scheduling. Pt is due for a f/u with Marcos Eke.

## 2023-08-26 ENCOUNTER — Other Ambulatory Visit: Payer: Self-pay

## 2023-08-26 NOTE — Progress Notes (Signed)
08/26/23 - Biktarvy  Refill Request Denied-- Patient needs an appointment. Left patient a voicemail.

## 2023-08-29 ENCOUNTER — Other Ambulatory Visit: Payer: Self-pay

## 2023-09-09 ENCOUNTER — Other Ambulatory Visit: Payer: Self-pay | Admitting: Family

## 2023-09-09 DIAGNOSIS — B20 Human immunodeficiency virus [HIV] disease: Secondary | ICD-10-CM

## 2023-09-10 ENCOUNTER — Other Ambulatory Visit: Payer: Self-pay

## 2023-09-10 MED ORDER — BIKTARVY 50-200-25 MG PO TABS
1.0000 | ORAL_TABLET | Freq: Every day | ORAL | 0 refills | Status: DC
Start: 2023-09-10 — End: 2023-09-15
  Filled 2023-09-10: qty 30, 30d supply, fill #0

## 2023-09-14 ENCOUNTER — Encounter: Payer: Self-pay | Admitting: Infectious Disease

## 2023-09-14 DIAGNOSIS — E785 Hyperlipidemia, unspecified: Secondary | ICD-10-CM

## 2023-09-14 HISTORY — DX: Hyperlipidemia, unspecified: E78.5

## 2023-09-14 NOTE — Progress Notes (Signed)
 Subjective:  Chief complaint follow-up for HIV disease on medications   Patient ID: Ronald [name], male    DOB: 12-19-1975, 48 y.o.   MRN: [id number]  HPI  Discussed the use of AI scribe software for clinical note transcription with the patient, who gave verbal consent to proceed.  History of Present Illness   Ronald [name], a patient with a history of HIV and high blood pressure, presents for a routine follow-up. The patient has been on Biktarvy  for HIV management and reports no issues with medication adherence. The patient's viral load has been consistently undetectable, indicating effective viral suppression. The patient also takes losartan ./HCTZ and amlodipine  for blood pressure control.  In the past, the patient had anal warts which were surgically removed. The patient reports no current issues related to this. The patient's husband, who is also HIV positive, is a patient at the same clinic.  The patient is due for a flu shot and a COVID-19 vaccine. The patient expresses some concern about the discomfort associated with receiving multiple injections but agrees to proceed.  The patient also discusses a previous colonoscopy and the doctor recommends a future anal Pap smear due to the patient's history of anal warts and receptive anal intercourse. The patient agrees to consider this for a future visit.  The patient is open to this suggestion.       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   Grieving 12/05/2020   HIV infection (HCC)    Hyperlipidemia 09/14/2023   Hypertension    Rash 08/03/2018   Tinea corporis 08/03/2018   Tinea versicolor 08/03/2018    Past Surgical History:  Procedure Laterality Date   EXAMINATION UNDER ANESTHESIA  12/27/2011   Procedure: EXAM UNDER ANESTHESIA;  Surgeon: Vicenta DELENA Poli, MD;  Location: [facility];  Service: General;  Laterality: N/A;   NO PAST SURGERIES     WART FULGURATION  12/27/2011   Procedure:  FULGURATION ANAL WART;  Surgeon: Vicenta DELENA Poli, MD;  Location: [facility];  Service: General;  Laterality: N/A;  Excision anal condyloma    Family History  Problem Relation Age of Onset   Hypertension Mother    Cancer Father    Diabetes Maternal Aunt    Seizures Paternal Aunt    Colon cancer Neg Hx    Colon polyps Neg Hx    Esophageal cancer Neg Hx    Rectal cancer Neg Hx    Stomach cancer Neg Hx       Social History   Socioeconomic History   Marital status: Married    Spouse name: Not on file   Number of children: Not on file   Years of education: Not on file   Highest education level: GED or equivalent  Occupational History   Occupation: Emergency Planning/management Officer: [employer]  Tobacco Use   Smoking status: Every Day    Current packs/day: 0.30    Average packs/day: 0.3 packs/day for 24.0 years (7.2 ttl pk-yrs)    Types: Cigarettes   Smokeless tobacco: Never   Tobacco comments:    Goes through a pack in 3 days. Cutting back.  Vaping Use   Vaping status: Never Used  Substance and Sexual Activity   Alcohol use: Yes    Comment: rare   Drug use: No    Frequency: 1.0 times per week   Sexual activity: Not Currently    Comment: declined condoms  Other Topics Concern  Not on file  Social History Narrative   Lives with [facility]   Social Drivers of Health   Financial Resource Strain: Low Risk  (04/08/2023)   Overall Financial Resource Strain (CARDIA)    Difficulty of Paying Living Expenses: Not hard at all  Food Insecurity: No Food Insecurity (04/08/2023)   Hunger Vital Sign    Worried About Running Out of Food in the Last Year: Never true    Ran Out of Food in the Last Year: Never true  Transportation Needs: No Transportation Needs (04/08/2023)   PRAPARE - Administrator, Civil Service (Medical): No    Lack of Transportation (Non-Medical): No  Physical Activity: Sufficiently Active (04/08/2023)   Exercise Vital Sign    Days of  Exercise per Week: 4 days    Minutes of Exercise per Session: 150+ min  Stress: No Stress Concern Present (04/08/2023)   [employer] of Occupational Health - Occupational Stress Questionnaire    Feeling of Stress : Not at all  Social Connections: Unknown (04/08/2023)   Social Connection and Isolation Panel [NHANES]    Frequency of Communication with Friends and Family: More than three times a week    Frequency of Social Gatherings with Friends and Family: Patient declined    Attends Religious Services: Patient declined    Database Administrator or Organizations: No    Attends Engineer, Structural: Not on file    Marital Status: Married    Allergies  Allergen Reactions   Ibuprofen Other (See Comments)    BLISTERS     Current Outpatient Medications:    amLODipine  (NORVASC ) 10 MG tablet, TAKE 1 TABLET BY MOUTH EVERY DAY, Disp: 90 tablet, Rfl: 1   bictegravir-emtricitabine -tenofovir  AF (BIKTARVY ) 50-200-25 MG TABS tablet, Take 1 tablet by mouth daily., Disp: 30 tablet, Rfl: 0   losartan -hydrochlorothiazide  (HYZAAR) 50-12.5 MG tablet, Take 1 tablet by mouth daily., Disp: 90 tablet, Rfl: 2   Multiple Vitamin (MULTIVITAMIN) tablet, Take 1 tablet by mouth daily., Disp: , Rfl:    Review of Systems  Constitutional:  Negative for activity change, appetite change, chills, diaphoresis, fatigue, fever and unexpected weight change.  HENT:  Negative for congestion, rhinorrhea, sinus pressure, sneezing, sore throat and trouble swallowing.   Eyes:  Negative for photophobia and visual disturbance.  Respiratory:  Negative for cough, chest tightness, shortness of breath, wheezing and stridor.   Cardiovascular:  Negative for chest pain, palpitations and leg swelling.  Gastrointestinal:  Negative for abdominal distention, abdominal pain, anal bleeding, blood in stool, constipation, diarrhea, nausea and vomiting.  Genitourinary:  Negative for difficulty urinating, dysuria, flank pain and  hematuria.  Musculoskeletal:  Negative for arthralgias, back pain, gait problem, joint swelling and myalgias.  Skin:  Negative for color change, pallor, rash and wound.  Neurological:  Negative for dizziness, tremors, weakness and light-headedness.  Hematological:  Negative for adenopathy. Does not bruise/bleed easily.  Psychiatric/Behavioral:  Negative for agitation, behavioral problems, confusion, decreased concentration, dysphoric mood and sleep disturbance.        Objective:   Physical Exam Constitutional:      Appearance: He is well-developed.  HENT:     Head: Normocephalic and atraumatic.  Eyes:     Conjunctiva/sclera: Conjunctivae normal.  Cardiovascular:     Rate and Rhythm: Normal rate and regular rhythm.  Pulmonary:     Effort: Pulmonary effort is normal. No respiratory distress.     Breath sounds: No wheezing.  Abdominal:     General: There  is no distension.     Palpations: Abdomen is soft.  Musculoskeletal:        General: No tenderness. Normal range of motion.     Cervical back: Normal range of motion and neck supple.  Skin:    General: Skin is warm and dry.     Coloration: Skin is not pale.     Findings: No erythema or rash.  Neurological:     General: No focal deficit present.     Mental Status: He is alert and oriented to person, place, and time.  Psychiatric:        Mood and Affect: Mood normal.        Behavior: Behavior normal.        Thought Content: Thought content normal.        Judgment: Judgment normal.           Assessment & Plan:   Assessment and Plan    HIV Viral load consistently below 50, indicating effective control with Biktarvy . Discussed the importance of consistent medication adherence and the potential for switching to Dovato if desired for fewer medications, but no current need for change. -Continue Biktarvy . -Check HIV labs today.  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 Discussed the  importance of regular screening for HPV-related abnormalities. -Perform anal Pap smear today.  Hypertension On Losartan /HCTZ and Amlodipine  for blood pressure control. -Continue current medications.  Hyperlipidemia Discussed recent research (REPRIEVE)  indicating benefit of statin therapy in HIV patients over 40. Recommended starting a statin. DHHS panel recommendations based on REPRIEVE:   For people with HIV who have low-to-intermediate (<20%) 10-year atherosclerotic cardiovascular disease (ASCVD) risk estimates  Age 26-75 years When 10-year ASCVD risk estimates are 5% to <20%, the Panel for the Use of Antiretroviral Agents in Adults and Adolescents with HIV (the Panel) recommends initiating at least moderate-intensity statin therapy (AI). Recommended options for moderate-intensity statin therapy include the following: Pitavastatin 4 mg once daily (AI) Atorvastatin 20 mg once daily (AII) Rosuvastatin  10 mg once daily (AII)    -Start Rosuvastatin  10mg    HTN: continue losartain/HCTZ and amlodipine   Immunizations Discussed the importance of receiving both the flu and COVID vaccines. -Administer flu and COVID vaccines today.   Anal cancer screening/prevention: recommended anal pap smear at next visit since he did not want to do this today

## 2023-09-15 ENCOUNTER — Other Ambulatory Visit: Payer: Self-pay

## 2023-09-15 ENCOUNTER — Other Ambulatory Visit (HOSPITAL_COMMUNITY)
Admission: RE | Admit: 2023-09-15 | Discharge: 2023-09-15 | Disposition: A | Discharge status: Home / Self Care | Payer: BC Managed Care – PPO | Source: Ambulatory Visit | Attending: Infectious Disease | Admitting: Infectious Disease

## 2023-09-15 ENCOUNTER — Other Ambulatory Visit (HOSPITAL_COMMUNITY): Payer: Self-pay

## 2023-09-15 ENCOUNTER — Ambulatory Visit: Payer: 59 | Admitting: Infectious Disease

## 2023-09-15 ENCOUNTER — Encounter: Payer: Self-pay | Admitting: Infectious Disease

## 2023-09-15 VITALS — BP 134/95 | HR 98 | Temp 98.3°F | Wt 182.0 lb

## 2023-09-15 DIAGNOSIS — B20 Human immunodeficiency virus [HIV] disease: Secondary | ICD-10-CM | POA: Insufficient documentation

## 2023-09-15 DIAGNOSIS — I10 Essential (primary) hypertension: Secondary | ICD-10-CM | POA: Diagnosis not present

## 2023-09-15 DIAGNOSIS — E785 Hyperlipidemia, unspecified: Secondary | ICD-10-CM | POA: Diagnosis not present

## 2023-09-15 DIAGNOSIS — Z23 Encounter for immunization: Secondary | ICD-10-CM

## 2023-09-15 MED ORDER — BIKTARVY 50-200-25 MG PO TABS
1.0000 | ORAL_TABLET | Freq: Every day | ORAL | 0 refills | Status: DC
Start: 2023-09-15 — End: 2023-11-09
  Filled 2023-09-15 – 2023-10-06 (×3): qty 30, 30d supply, fill #0

## 2023-09-15 MED ORDER — ROSUVASTATIN CALCIUM 10 MG PO TABS
10.0000 mg | ORAL_TABLET | Freq: Every day | ORAL | 11 refills | Status: DC
  Filled 2023-09-15 – 2023-10-01 (×3): qty 30, 30d supply, fill #0
  Filled 2023-10-26: qty 30, 30d supply, fill #1
  Filled 2023-12-01: qty 30, 30d supply, fill #2
  Filled 2023-12-27: qty 30, 30d supply, fill #3
  Filled 2024-01-28: qty 30, 30d supply, fill #4
  Filled 2024-02-25: qty 30, 30d supply, fill #5

## 2023-09-17 LAB — URINE CYTOLOGY ANCILLARY ONLY
Chlamydia: NEGATIVE
Comment: NEGATIVE
Comment: NORMAL
Neisseria Gonorrhea: NEGATIVE

## 2023-09-18 LAB — CBC WITH DIFFERENTIAL/PLATELET
Absolute Lymphocytes: 1776 {cells}/uL (ref 850–3900)
Absolute Monocytes: 488 {cells}/uL (ref 200–950)
Basophils Absolute: 51 {cells}/uL (ref 0–200)
Basophils Relative: 1.1 %
Eosinophils Absolute: 156 {cells}/uL (ref 15–500)
Eosinophils Relative: 3.4 %
HCT: 51.1 % — ABNORMAL HIGH (ref 38.5–50.0)
Hemoglobin: 17.4 g/dL — ABNORMAL HIGH (ref 13.2–17.1)
MCH: 29.8 pg (ref 27.0–33.0)
MCHC: 34.1 g/dL (ref 32.0–36.0)
MCV: 87.7 fL (ref 80.0–100.0)
MPV: 11.1 fL (ref 7.5–12.5)
Monocytes Relative: 10.6 %
Neutro Abs: 2130 {cells}/uL (ref 1500–7800)
Neutrophils Relative %: 46.3 %
Platelets: 238 10*3/uL (ref 140–400)
RBC: 5.83 10*6/uL — ABNORMAL HIGH (ref 4.20–5.80)
RDW: 16.8 % — ABNORMAL HIGH (ref 11.0–15.0)
Total Lymphocyte: 38.6 %
WBC: 4.6 10*3/uL (ref 3.8–10.8)

## 2023-09-18 LAB — COMPLETE METABOLIC PANEL WITH GFR
AG Ratio: 1.5 (calc) (ref 1.0–2.5)
ALT: 19 U/L (ref 9–46)
AST: 26 U/L (ref 10–40)
Albumin: 4.9 g/dL (ref 3.6–5.1)
Alkaline phosphatase (APISO): 71 U/L (ref 36–130)
BUN/Creatinine Ratio: 15 (calc) (ref 6–22)
BUN: 21 mg/dL (ref 7–25)
CO2: 30 mmol/L (ref 20–32)
Calcium: 10.1 mg/dL (ref 8.6–10.3)
Chloride: 97 mmol/L — ABNORMAL LOW (ref 98–110)
Creat: 1.4 mg/dL — ABNORMAL HIGH (ref 0.60–1.29)
Globulin: 3.3 g/dL (ref 1.9–3.7)
Glucose, Bld: 96 mg/dL (ref 65–99)
Potassium: 3.6 mmol/L (ref 3.5–5.3)
Sodium: 138 mmol/L (ref 135–146)
Total Bilirubin: 0.6 mg/dL (ref 0.2–1.2)
Total Protein: 8.2 g/dL — ABNORMAL HIGH (ref 6.1–8.1)
eGFR: 63 mL/min/{1.73_m2} (ref >=60)

## 2023-09-18 LAB — LIPID PANEL
Cholesterol: 187 mg/dL (ref <200)
HDL: 68 mg/dL (ref >=40)
LDL Cholesterol (Calc): 100 mg/dL — ABNORMAL HIGH
Non-HDL Cholesterol (Calc): 119 mg/dL (ref <130)
Total CHOL/HDL Ratio: 2.8 (calc) (ref <5.0)
Triglycerides: 92 mg/dL (ref <150)

## 2023-09-18 LAB — T-HELPER CELLS (CD4) COUNT (NOT AT ARMC)
Absolute CD4: 463 {cells}/uL — ABNORMAL LOW (ref 490–1740)
CD4 T Helper %: 23 % — ABNORMAL LOW (ref 30–61)
Total lymphocyte count: 1980 {cells}/uL (ref 850–3900)

## 2023-09-18 LAB — HIV-1 RNA QUANT-NO REFLEX-BLD
HIV 1 RNA Quant: NOT DETECTED {copies}/mL
HIV-1 RNA Quant, Log: NOT DETECTED {Log}

## 2023-09-18 LAB — RPR: RPR Ser Ql: NONREACTIVE

## 2023-09-26 ENCOUNTER — Other Ambulatory Visit (HOSPITAL_COMMUNITY): Payer: Self-pay

## 2023-09-29 ENCOUNTER — Other Ambulatory Visit (HOSPITAL_COMMUNITY): Payer: Self-pay

## 2023-09-30 ENCOUNTER — Other Ambulatory Visit (HOSPITAL_COMMUNITY): Payer: Self-pay

## 2023-09-30 NOTE — Progress Notes (Signed)
Specialty Pharmacy Refill Coordination Note  Ronald [name] is a 48 y.o. male contacted today regarding refills of specialty medication(s) Bictegravir-Emtricitab-Tenofov Susanne Borders)   Patient requested Delivery   Delivery date: 10/07/23   Verified address: [address] [place] [address]   Medication will be filled on 10/06/23.

## 2023-10-01 ENCOUNTER — Other Ambulatory Visit (HOSPITAL_COMMUNITY): Payer: Self-pay

## 2023-10-06 ENCOUNTER — Other Ambulatory Visit: Payer: 59

## 2023-10-06 ENCOUNTER — Other Ambulatory Visit (HOSPITAL_COMMUNITY): Payer: Self-pay

## 2023-10-06 DIAGNOSIS — E7800 Pure hypercholesterolemia, unspecified: Secondary | ICD-10-CM

## 2023-10-09 ENCOUNTER — Encounter: Payer: 59 | Admitting: Orthopedic Surgery

## 2023-10-10 NOTE — Progress Notes (Signed)
 This encounter was created in error - please disregard.

## 2023-10-23 ENCOUNTER — Other Ambulatory Visit: Payer: Self-pay

## 2023-10-27 ENCOUNTER — Other Ambulatory Visit (HOSPITAL_COMMUNITY): Payer: Self-pay

## 2023-10-27 ENCOUNTER — Other Ambulatory Visit: Payer: Self-pay

## 2023-10-29 ENCOUNTER — Other Ambulatory Visit: Payer: Self-pay

## 2023-11-09 ENCOUNTER — Other Ambulatory Visit: Payer: Self-pay | Admitting: Infectious Disease

## 2023-11-09 DIAGNOSIS — B20 Human immunodeficiency virus [HIV] disease: Secondary | ICD-10-CM

## 2023-11-10 ENCOUNTER — Other Ambulatory Visit: Payer: Self-pay

## 2023-11-10 MED ORDER — BIKTARVY 50-200-25 MG PO TABS
1.0000 | ORAL_TABLET | Freq: Every day | ORAL | 5 refills | Status: DC
  Filled 2023-11-10 – 2023-12-17 (×3): qty 30, 30d supply, fill #0
  Filled 2024-01-08 (×2): qty 30, 30d supply, fill #1
  Filled 2024-02-11 – 2024-02-13 (×2): qty 30, 30d supply, fill #2
  Filled 2024-03-15: qty 30, 30d supply, fill #3
  Filled 2024-04-12: qty 30, 30d supply, fill #4

## 2023-11-15 ENCOUNTER — Other Ambulatory Visit: Payer: Self-pay | Admitting: Orthopedic Surgery

## 2023-11-15 DIAGNOSIS — I10 Essential (primary) hypertension: Secondary | ICD-10-CM

## 2023-11-25 ENCOUNTER — Other Ambulatory Visit: Payer: Self-pay | Admitting: Orthopedic Surgery

## 2023-11-25 DIAGNOSIS — I10 Essential (primary) hypertension: Secondary | ICD-10-CM

## 2023-12-17 ENCOUNTER — Other Ambulatory Visit (HOSPITAL_COMMUNITY): Payer: Self-pay

## 2023-12-17 ENCOUNTER — Other Ambulatory Visit: Payer: Self-pay

## 2023-12-17 ENCOUNTER — Other Ambulatory Visit: Payer: Self-pay | Admitting: Pharmacy Technician

## 2023-12-17 NOTE — Progress Notes (Signed)
 Specialty Pharmacy Refill Coordination Note  Ronald [name] is a 48 y.o. male contacted today regarding refills of specialty medication(s) Bictegravir-Emtricitab-Tenofov Maryruth Sol)   Patient requested Delivery   Delivery date: 12/19/23   Verified address: [address]  [place]   Medication will be filled on 12/18/23.

## 2023-12-18 ENCOUNTER — Encounter: Payer: Self-pay | Admitting: Orthopedic Surgery

## 2023-12-18 ENCOUNTER — Ambulatory Visit: Payer: Self-pay | Admitting: Orthopedic Surgery

## 2023-12-18 ENCOUNTER — Other Ambulatory Visit: Payer: Self-pay

## 2023-12-18 VITALS — BP 130/88 | HR 101 | Temp 98.1°F | Resp 16 | Ht 66.0 in | Wt 183.6 lb

## 2023-12-18 DIAGNOSIS — E7800 Pure hypercholesterolemia, unspecified: Secondary | ICD-10-CM | POA: Diagnosis not present

## 2023-12-18 DIAGNOSIS — B20 Human immunodeficiency virus [HIV] disease: Secondary | ICD-10-CM | POA: Diagnosis not present

## 2023-12-18 DIAGNOSIS — I10 Essential (primary) hypertension: Secondary | ICD-10-CM

## 2023-12-18 DIAGNOSIS — F17210 Nicotine dependence, cigarettes, uncomplicated: Secondary | ICD-10-CM | POA: Diagnosis not present

## 2023-12-18 DIAGNOSIS — R519 Headache, unspecified: Secondary | ICD-10-CM

## 2023-12-18 NOTE — Patient Instructions (Addendum)
 Refrain from Benadryl, try Zyrtec ofr Xyzal for allergies> much safer  Try to quit smoking  Please get Tdap at local pharmacy

## 2023-12-18 NOTE — Progress Notes (Signed)
 Careteam: Patient Care Team: Arnetha Bhat, NP as PCP - General (Adult Health Nurse Practitioner) Ernie Heal, Jerelyn Money, MD as PCP - Infectious Diseases (Infectious Diseases)  Seen by: Ulyses Gandy, AGNP-C  PLACE OF SERVICE:  [facility]  Advanced Directive information Does Patient Have a Medical Advance Directive?: No, Would patient like information on creating a medical advance directive?: No - Patient declined  Allergies  Allergen Reactions   Ibuprofen Other (See Comments)    BLISTERS    Chief Complaint  Patient presents with   Medical Management of Chronic Issues    6+ month follow up. Discuss the need for DTAP vaccine.     HPI: Patient is a 48 y.o. male seen today for medical management of chronic conditions.   Discussed the use of AI scribe software for clinical note transcription with the patient, who gave verbal consent to proceed.  History of Present Illness   Ronald [name] is a 48 year old male with hypertension and HIV who presents for routine follow-up.  He has a blood pressure reading of 136/100 today and is currently taking amlodipine  and Hyzaar, which he took just before the appointment. Rechecked blood pressure 130/88. Denies chest pain, sob, blurred vision.  Admits to low sodium diet.   He has been experiencing unilateral head pain that radiates upwards, persisting for about six months. He associates this pain with increased smoking. Admits to increased stress related to house guest that is staying with him. Admits to cutting down smoking, but has not quit completely.   He is currently taking Biktarvy  for HIV and tolerates it well. He follows up with infectious disease specialists for his HIV management, and they conduct his lab work. His last lipid panel three months ago was normal, and he is on Crestor  for cholesterol management.  He has allergies and takes Benadryl at night. He drinks a lot of water and enjoys it. Discussed Zyrtec  and Xyzal as safer options  for allergies.   He recently had a colonoscopy on May 02, 2023, which was clean with no polyps.   Discussed need for Tdap.       Review of Systems:  Review of Systems  Constitutional: Negative.   HENT: Negative.    Eyes: Negative.   Respiratory: Negative.    Cardiovascular: Negative.   Gastrointestinal: Negative.   Genitourinary: Negative.   Musculoskeletal: Negative.   Skin: Negative.   Neurological:  Positive for headaches.  Psychiatric/Behavioral: Negative.      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   Grieving 12/05/2020   HIV infection (HCC)    Hyperlipidemia 09/14/2023   Hypertension    Rash 08/03/2018   Tinea corporis 08/03/2018   Tinea versicolor 08/03/2018   Past Surgical History:  Procedure Laterality Date   EXAMINATION UNDER ANESTHESIA  12/27/2011   Procedure: EXAM UNDER ANESTHESIA;  Surgeon: Rogena Class, MD;  Location: [facility];  Service: General;  Laterality: N/A;   NO PAST SURGERIES     WART FULGURATION  12/27/2011   Procedure: FULGURATION ANAL WART;  Surgeon: Rogena Class, MD;  Location:  SURGERY CENTER;  Service: General;  Laterality: N/A;  Excision anal condyloma   Social History:   reports that he has been smoking cigarettes. He has a 7.2 pack-year smoking history. He has never used smokeless tobacco. He reports current alcohol use. He reports that he does not use drugs.  Family History  Problem Relation Age of Onset  Hypertension Mother    Cancer Father    Diabetes Maternal Aunt    Seizures Paternal Aunt    Colon cancer Neg Hx    Colon polyps Neg Hx    Esophageal cancer Neg Hx    Rectal cancer Neg Hx    Stomach cancer Neg Hx     Medications: Patient's Medications  New Prescriptions   No medications on file  Previous Medications   AMLODIPINE  (NORVASC ) 10 MG TABLET    TAKE 1 TABLET BY MOUTH EVERY DAY   BICTEGRAVIR-EMTRICITABINE -TENOFOVIR  AF (BIKTARVY ) 50-200-25 MG TABS  TABLET    Take 1 tablet by mouth daily.   LOSARTAN -HYDROCHLOROTHIAZIDE  (HYZAAR) 50-12.5 MG TABLET    TAKE 1 TABLET BY MOUTH EVERY DAY   MULTIPLE VITAMIN (MULTIVITAMIN) TABLET    Take 1 tablet by mouth daily.   ROSUVASTATIN  (CRESTOR ) 10 MG TABLET    Take 1 tablet (10 mg total) by mouth daily.  Modified Medications   No medications on file  Discontinued Medications   No medications on file    Physical Exam:  Vitals:   12/18/23 1301  BP: (!) 136/100  Pulse: (!) 101  Resp: 16  Temp: 98.1 F (36.7 C)  SpO2: 98%  Weight: 183 lb 9.6 oz (83.3 kg)  Height: 5\' 6"  (1.676 m)   Body mass index is 29.63 kg/m. Wt Readings from Last 3 Encounters:  12/18/23 183 lb 9.6 oz (83.3 kg)  09/15/23 182 lb (82.6 kg)  05/02/23 176 lb (79.8 kg)    Physical Exam Vitals reviewed.  Constitutional:      General: He is not in acute distress. HENT:     Head: Normocephalic.  Eyes:     General:        Right eye: No discharge.        Left eye: No discharge.     Extraocular Movements: Extraocular movements intact.     Pupils: Pupils are equal, round, and reactive to light.  Cardiovascular:     Rate and Rhythm: Normal rate and regular rhythm.     Pulses: Normal pulses.     Heart sounds: Normal heart sounds.  Pulmonary:     Effort: Pulmonary effort is normal.     Breath sounds: Normal breath sounds.  Abdominal:     General: Bowel sounds are normal.     Palpations: Abdomen is soft.  Musculoskeletal:     Cervical back: Neck supple.     Right lower leg: No edema.     Left lower leg: No edema.  Skin:    General: Skin is warm.     Capillary Refill: Capillary refill takes less than 2 seconds.  Neurological:     General: No focal deficit present.     Mental Status: He is alert and oriented to person, place, and time.  Psychiatric:        Mood and Affect: Mood normal.     Labs reviewed: Basic Metabolic Panel: Recent Labs    01/14/23 0316 09/15/23 1629  NA 138 138  K 3.7 3.6  CL 98 97*   CO2 30 30  GLUCOSE 96 96  BUN 15 21  CREATININE 1.41* 1.40*  CALCIUM  9.8 10.1   Liver Function Tests: Recent Labs    09/15/23 1629  AST 26  ALT 19  BILITOT 0.6  PROT 8.2*   No results for input(s): "LIPASE", "AMYLASE" in the last 8760 hours. No results for input(s): "AMMONIA" in the last 8760 hours. CBC: Recent Labs  09/15/23 1629  WBC 4.6  NEUTROABS 2,130  HGB 17.4*  HCT 51.1*  MCV 87.7  PLT 238   Lipid Panel: Recent Labs    09/15/23 1629  CHOL 187  HDL 68  LDLCALC 100*  TRIG 92  CHOLHDL 2.8   TSH: No results for input(s): "TSH" in the last 8760 hours. A1C: Lab Results  Component Value Date   HGBA1C 4.5 09/06/2019     Assessment/Plan 1. Essential hypertension (Primary) - controlled, goal < 140/80 - BUN/creat 21/1.40 09/15/2023 - cont amlodipine  and Hyzarr  2. Hypercholesteremia - total 139, LDL 61 12/18/2023 - ID started rosuvastatin  09/2023> LDL was 100 - cont rosuvastatin   3. HIV disease (HCC) - followed by ID - cont Biktarvy   4. Smoking 1/2 pack a day or less - habit associated with stress - no plans to quit at this time  5. Acute nonintractable headache, unspecified headache type - increased headache pain associated with smoking - ? Cluster headache - smoking cessation discussed  Total time: 31. Greater than 50% of total time spent doing patient education regarding HTN, HLD, HIV, smoking and headaches including symptom/medication management.     Next appt: Visit date not found  Gael Delude Darral Ellis  [facility] [phone]

## 2023-12-19 LAB — LIPID PANEL
Cholesterol: 139 mg/dL (ref <200)
HDL: 63 mg/dL (ref >=40)
LDL Cholesterol (Calc): 61 mg/dL
Non-HDL Cholesterol (Calc): 76 mg/dL (ref <130)
Total CHOL/HDL Ratio: 2.2 (calc) (ref <5.0)
Triglycerides: 74 mg/dL (ref <150)

## 2023-12-22 ENCOUNTER — Encounter: Payer: Self-pay | Admitting: Orthopedic Surgery

## 2024-01-06 ENCOUNTER — Other Ambulatory Visit (HOSPITAL_COMMUNITY): Payer: Self-pay

## 2024-01-06 ENCOUNTER — Other Ambulatory Visit: Payer: Self-pay

## 2024-01-08 ENCOUNTER — Other Ambulatory Visit: Payer: Self-pay

## 2024-01-08 ENCOUNTER — Other Ambulatory Visit (HOSPITAL_COMMUNITY): Payer: Self-pay

## 2024-01-08 NOTE — Progress Notes (Signed)
 Specialty Pharmacy Refill Coordination Note  Ronald [name] is a 48 y.o. male contacted today regarding refills of specialty medication(s) Bictegravir-Emtricitab-Tenofov (Biktarvy )   Patient requested Delivery   Delivery date: 01/14/24   Verified address: [address] [name] [address]   Medication will be filled on 01/13/24.

## 2024-01-08 NOTE — Progress Notes (Signed)
 Specialty Pharmacy Ongoing Clinical Assessment Note  Ronald [name] is a 48 y.o. male who is being followed by the specialty pharmacy service for RxSp HIV   Patient's specialty medication(s) reviewed today: Bictegravir-Emtricitab-Tenofov (Biktarvy )   Missed doses in the last 4 weeks: 0   Patient/Caregiver did not have any additional questions or concerns.   Therapeutic benefit summary: Patient is achieving benefit   Adverse events/side effects summary: No adverse events/side effects   Patient's therapy is appropriate to: Continue    Goals Addressed             This Visit's Progress    Achieve Undetectable HIV Viral Load < 20   On track    Patient is on track. Patient will maintain adherence. Patient's viral load remains undetectable long term         Follow up: 6 months  [facility]

## 2024-01-14 NOTE — Progress Notes (Signed)
 The 10-year ASCVD risk score (Arnett DK, et al., 2019) is: 10.5%   Values used to calculate the score:     Age: 48 years     Sex: Male     Is Non-Hispanic African American: Yes     Diabetic: No     Tobacco smoker: Yes     Systolic Blood Pressure: 130 mmHg     Is BP treated: Yes     HDL Cholesterol: 63 mg/dL     Total Cholesterol: 139 mg/dL  Currently prescribed rosuvastatin  10 mg.   Charnelle Bergeman, BSN, RN

## 2024-02-11 ENCOUNTER — Other Ambulatory Visit: Payer: Self-pay

## 2024-02-12 ENCOUNTER — Other Ambulatory Visit: Payer: Self-pay

## 2024-02-13 ENCOUNTER — Other Ambulatory Visit (HOSPITAL_COMMUNITY): Payer: Self-pay

## 2024-02-13 ENCOUNTER — Other Ambulatory Visit: Payer: Self-pay

## 2024-02-13 ENCOUNTER — Encounter (INDEPENDENT_AMBULATORY_CARE_PROVIDER_SITE_OTHER): Payer: Self-pay

## 2024-02-13 NOTE — Progress Notes (Signed)
 Specialty Pharmacy Refill Coordination Note  Spoke with Ronald [name], Ronald [name] (Self).   Ronald [name] is a 48 y.o. male contacted today regarding refills of specialty medication(s) Bictegravir-Emtricitab-Tenofov (Biktarvy )  Patient requested Delivery   Delivery date: 02/17/24   Verified address: [address], [address] [name] [address]  Medication will be filled on 02/16/24.

## 2024-02-16 ENCOUNTER — Other Ambulatory Visit (HOSPITAL_COMMUNITY): Payer: Self-pay

## 2024-03-11 ENCOUNTER — Other Ambulatory Visit: Payer: Self-pay

## 2024-03-14 ENCOUNTER — Encounter (INDEPENDENT_AMBULATORY_CARE_PROVIDER_SITE_OTHER): Payer: Self-pay

## 2024-03-15 ENCOUNTER — Other Ambulatory Visit: Payer: Self-pay

## 2024-03-15 ENCOUNTER — Ambulatory Visit: Payer: BC Managed Care – PPO | Admitting: Infectious Disease

## 2024-03-15 DIAGNOSIS — B20 Human immunodeficiency virus [HIV] disease: Secondary | ICD-10-CM

## 2024-03-15 DIAGNOSIS — E785 Hyperlipidemia, unspecified: Secondary | ICD-10-CM

## 2024-03-15 NOTE — Progress Notes (Signed)
 Specialty Pharmacy Refill Coordination Note  Ronald [name] is a 48 y.o. male contacted today regarding refills of specialty medication(s) Bictegravir-Emtricitab-Tenofov (Biktarvy )   Patient requested (Patient-Rptd) Delivery   Delivery date: 03/17/24   Verified address: (Patient-Rptd) [address] [place] [address]   Medication will be filled on 03/16/24.

## 2024-04-12 ENCOUNTER — Other Ambulatory Visit: Payer: Self-pay

## 2024-04-14 ENCOUNTER — Other Ambulatory Visit: Payer: Self-pay | Admitting: Pharmacy Technician

## 2024-04-14 ENCOUNTER — Other Ambulatory Visit: Payer: Self-pay

## 2024-04-14 NOTE — Progress Notes (Signed)
 Specialty Pharmacy Refill Coordination Note  Marvis Saefong is a 48 y.o. male contacted today regarding refills of specialty medication(s) Bictegravir-Emtricitab-Tenofov (Biktarvy )   Patient requested Delivery   Delivery date: 04/16/24   Verified address: [address]  [place]   Medication will be filled on 04/15/24.

## 2024-04-20 ENCOUNTER — Encounter: Payer: Self-pay | Admitting: Infectious Disease

## 2024-04-20 DIAGNOSIS — I10 Essential (primary) hypertension: Secondary | ICD-10-CM | POA: Insufficient documentation

## 2024-04-20 DIAGNOSIS — Z1212 Encounter for screening for malignant neoplasm of rectum: Secondary | ICD-10-CM | POA: Insufficient documentation

## 2024-04-20 NOTE — Progress Notes (Unsigned)
 Subjective:  Chief complaint: follow-up for HIV disease on medications   Patient ID: Ronald [name], male    DOB: 04-Feb-1976, 48 y.o.   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   Grieving 12/05/2020   HIV infection (HCC)    Hyperlipidemia 09/14/2023   Hypertension    Rash 08/03/2018   Tinea corporis 08/03/2018   Tinea versicolor 08/03/2018    Past Surgical History:  Procedure Laterality Date   EXAMINATION UNDER ANESTHESIA  12/27/2011   Procedure: EXAM UNDER ANESTHESIA;  Surgeon: Vicenta DELENA Poli, MD;  Location: [facility];  Service: General;  Laterality: N/A;   NO PAST SURGERIES     WART FULGURATION  12/27/2011   Procedure: FULGURATION ANAL WART;  Surgeon: Vicenta DELENA Poli, MD;  Location: [facility];  Service: General;  Laterality: N/A;  Excision anal condyloma    Family History  Problem Relation Age of Onset   Hypertension Mother    Cancer Father    Diabetes Maternal Aunt    Seizures Paternal Aunt    Colon cancer Neg Hx    Colon polyps Neg Hx    Esophageal cancer Neg Hx    Rectal cancer Neg Hx    Stomach cancer Neg Hx       Social History   Socioeconomic History   Marital status: Married    Spouse name: Not on file   Number of children: Not on file   Years of education: Not on file   Highest education level: GED or equivalent  Occupational History   Occupation: Emergency planning/management officer: [employer]  Tobacco Use   Smoking status: Every Day    Current packs/day: 0.30    Average packs/day: 0.3 packs/day for 24.0 years (7.2 ttl pk-yrs)    Types: Cigarettes   Smokeless tobacco: Never   Tobacco comments:    Goes through a pack in 3 days. Cutting back.  Vaping Use   Vaping status: Never Used  Substance and Sexual Activity   Alcohol use: Yes    Comment: rare   Drug use: No    Frequency: 1.0 times per week   Sexual activity: Not Currently    Comment: declined condoms  Other  Topics Concern   Not on file  Social History Narrative   Lives with [facility]   Social Drivers of Health   Financial Resource Strain: Low Risk  (04/08/2023)   Overall Financial Resource Strain (CARDIA)    Difficulty of Paying Living Expenses: Not hard at all  Food Insecurity: No Food Insecurity (12/18/2023)   Hunger Vital Sign    Worried About Running Out of Food in the Last Year: Never true    Ran Out of Food in the Last Year: Never true  Transportation Needs: No Transportation Needs (12/18/2023)   PRAPARE - Administrator, Civil Service (Medical): No    Lack of Transportation (Non-Medical): No  Physical Activity: Sufficiently Active (04/08/2023)   Exercise Vital Sign    Days of Exercise per Week: 4 days    Minutes of Exercise per Session: 150+ min  Stress: No Stress Concern Present (04/08/2023)   [employer] of Occupational Health - Occupational Stress Questionnaire    Feeling of Stress : Not at all  Social Connections: Socially Integrated (12/18/2023)   Social Connection and Isolation Panel    Frequency of Communication with Friends and Family: More than three times a week  Frequency of Social Gatherings with Friends and Family: More than three times a week    Attends Religious Services: More than 4 times per year    Active Member of [employer] or Organizations: Yes    Attends Banker Meetings: Never    Marital Status: Married    Allergies  Allergen Reactions   Ibuprofen Other (See Comments)    BLISTERS     Current Outpatient Medications:    amLODipine  (NORVASC ) 10 MG tablet, TAKE 1 TABLET BY MOUTH EVERY DAY, Disp: 90 tablet, Rfl: 1   bictegravir-emtricitabine -tenofovir  AF (BIKTARVY ) 50-200-25 MG TABS tablet, Take 1 tablet by mouth daily., Disp: 30 tablet, Rfl: 5   losartan -hydrochlorothiazide  (HYZAAR) 50-12.5 MG tablet, TAKE 1 TABLET BY MOUTH EVERY DAY, Disp: 90 tablet, Rfl: 2   Multiple Vitamin (MULTIVITAMIN) tablet, Take 1 tablet by mouth daily.,  Disp: , Rfl:    rosuvastatin  (CRESTOR ) 10 MG tablet, Take 1 tablet (10 mg total) by mouth daily., Disp: 30 tablet, Rfl: 11   Review of Systems     Objective:   Physical Exam        Assessment & Plan:

## 2024-04-21 ENCOUNTER — Other Ambulatory Visit (HOSPITAL_COMMUNITY): Payer: Self-pay

## 2024-04-21 ENCOUNTER — Other Ambulatory Visit (HOSPITAL_COMMUNITY)
Admission: RE | Admit: 2024-04-21 | Discharge: 2024-04-21 | Disposition: A | Discharge status: Home / Self Care | Source: Ambulatory Visit | Attending: Infectious Disease | Admitting: Infectious Disease

## 2024-04-21 ENCOUNTER — Other Ambulatory Visit: Payer: Self-pay

## 2024-04-21 ENCOUNTER — Ambulatory Visit: Admitting: Infectious Disease

## 2024-04-21 ENCOUNTER — Encounter: Payer: Self-pay | Admitting: Infectious Disease

## 2024-04-21 VITALS — BP 133/91 | HR 87 | Temp 98.2°F | Ht 66.0 in | Wt 187.0 lb

## 2024-04-21 DIAGNOSIS — B20 Human immunodeficiency virus [HIV] disease: Secondary | ICD-10-CM

## 2024-04-21 DIAGNOSIS — A630 Anogenital (venereal) warts: Secondary | ICD-10-CM | POA: Diagnosis not present

## 2024-04-21 DIAGNOSIS — E785 Hyperlipidemia, unspecified: Secondary | ICD-10-CM

## 2024-04-21 DIAGNOSIS — I10 Essential (primary) hypertension: Secondary | ICD-10-CM

## 2024-04-21 DIAGNOSIS — Z1212 Encounter for screening for malignant neoplasm of rectum: Secondary | ICD-10-CM

## 2024-04-21 DIAGNOSIS — R238 Other skin changes: Secondary | ICD-10-CM

## 2024-04-21 DIAGNOSIS — M542 Cervicalgia: Secondary | ICD-10-CM

## 2024-04-21 DIAGNOSIS — Z7185 Encounter for immunization safety counseling: Secondary | ICD-10-CM

## 2024-04-21 MED ORDER — BIKTARVY 50-200-25 MG PO TABS
1.0000 | ORAL_TABLET | Freq: Every day | ORAL | 5 refills | Status: AC
  Filled 2024-04-21 – 2024-05-19 (×4): qty 30, 30d supply, fill #0
  Filled 2024-06-14: qty 30, 30d supply, fill #1
  Filled 2024-07-14 – 2024-07-30 (×3): qty 30, 30d supply, fill #2
  Filled 2024-08-19 – 2024-08-23 (×2): qty 30, 30d supply, fill #3
  Filled 2024-09-17: qty 30, 30d supply, fill #4

## 2024-04-21 MED ORDER — ROSUVASTATIN CALCIUM 10 MG PO TABS
10.0000 mg | ORAL_TABLET | Freq: Every day | ORAL | 11 refills | Status: AC
  Filled 2024-04-21 – 2024-05-04 (×2): qty 30, 30d supply, fill #0
  Filled 2024-05-19 – 2024-05-28 (×3): qty 30, 30d supply, fill #1
  Filled 2024-07-09: qty 30, 30d supply, fill #2
  Filled 2024-07-29 – 2024-08-12 (×3): qty 30, 30d supply, fill #3
  Filled 2024-08-24 – 2024-09-09 (×2): qty 30, 30d supply, fill #4

## 2024-04-22 LAB — T-HELPER CELLS (CD4) COUNT (NOT AT ARMC)
CD4 % Helper T Cell: 27 % — ABNORMAL LOW (ref 33–65)
CD4 T Cell Abs: 380 /uL — ABNORMAL LOW (ref 400–1790)

## 2024-04-23 LAB — COMPLETE METABOLIC PANEL WITHOUT GFR
AG Ratio: 1.6 (calc) (ref 1.0–2.5)
ALT: 22 U/L (ref 9–46)
AST: 25 U/L (ref 10–40)
Albumin: 4.9 g/dL (ref 3.6–5.1)
Alkaline phosphatase (APISO): 64 U/L (ref 36–130)
BUN/Creatinine Ratio: 16 (calc) (ref 6–22)
BUN: 23 mg/dL (ref 7–25)
CO2: 28 mmol/L (ref 20–32)
Calcium: 9.8 mg/dL (ref 8.6–10.3)
Chloride: 101 mmol/L (ref 98–110)
Creat: 1.48 mg/dL — ABNORMAL HIGH (ref 0.60–1.29)
Globulin: 3 g/dL (ref 1.9–3.7)
Glucose, Bld: 87 mg/dL (ref 65–99)
Potassium: 3.9 mmol/L (ref 3.5–5.3)
Sodium: 137 mmol/L (ref 135–146)
Total Bilirubin: 0.5 mg/dL (ref 0.2–1.2)
Total Protein: 7.9 g/dL (ref 6.1–8.1)

## 2024-04-23 LAB — LIPID PANEL
Cholesterol: 128 mg/dL (ref <200)
HDL: 54 mg/dL (ref >=40)
LDL Cholesterol (Calc): 56 mg/dL
Non-HDL Cholesterol (Calc): 74 mg/dL (ref <130)
Total CHOL/HDL Ratio: 2.4 (calc) (ref <5.0)
Triglycerides: 94 mg/dL (ref <150)

## 2024-04-23 LAB — CBC WITH DIFFERENTIAL/PLATELET
Absolute Lymphocytes: 1449 {cells}/uL (ref 850–3900)
Absolute Monocytes: 469 {cells}/uL (ref 200–950)
Basophils Absolute: 39 {cells}/uL (ref 0–200)
Basophils Relative: 0.9 %
Eosinophils Absolute: 82 {cells}/uL (ref 15–500)
Eosinophils Relative: 1.9 %
HCT: 50 % (ref 38.5–50.0)
Hemoglobin: 17.1 g/dL (ref 13.2–17.1)
MCH: 30.2 pg (ref 27.0–33.0)
MCHC: 34.2 g/dL (ref 32.0–36.0)
MCV: 88.2 fL (ref 80.0–100.0)
MPV: 10.8 fL (ref 7.5–12.5)
Monocytes Relative: 10.9 %
Neutro Abs: 2262 {cells}/uL (ref 1500–7800)
Neutrophils Relative %: 52.6 %
Platelets: 195 Thousand/uL (ref 140–400)
RBC: 5.67 Million/uL (ref 4.20–5.80)
RDW: 16.7 % — ABNORMAL HIGH (ref 11.0–15.0)
Total Lymphocyte: 33.7 %
WBC: 4.3 Thousand/uL (ref 3.8–10.8)

## 2024-04-23 LAB — URINE CYTOLOGY ANCILLARY ONLY
Chlamydia: NEGATIVE
Comment: NEGATIVE
Comment: NORMAL
Neisseria Gonorrhea: NEGATIVE

## 2024-04-23 LAB — HIV-1 RNA QUANT-NO REFLEX-BLD
HIV 1 RNA Quant: NOT DETECTED {copies}/mL
HIV-1 RNA Quant, Log: NOT DETECTED {Log_copies}/mL

## 2024-04-23 LAB — RPR: RPR Ser Ql: NONREACTIVE

## 2024-05-04 ENCOUNTER — Other Ambulatory Visit (HOSPITAL_COMMUNITY): Payer: Self-pay

## 2024-05-05 ENCOUNTER — Other Ambulatory Visit: Payer: Self-pay

## 2024-05-05 ENCOUNTER — Other Ambulatory Visit (HOSPITAL_COMMUNITY): Payer: Self-pay

## 2024-05-10 ENCOUNTER — Other Ambulatory Visit: Payer: Self-pay

## 2024-05-12 ENCOUNTER — Other Ambulatory Visit: Payer: Self-pay

## 2024-05-18 ENCOUNTER — Other Ambulatory Visit: Payer: Self-pay

## 2024-05-20 ENCOUNTER — Other Ambulatory Visit: Payer: Self-pay

## 2024-05-20 ENCOUNTER — Other Ambulatory Visit (HOSPITAL_COMMUNITY): Payer: Self-pay

## 2024-05-20 NOTE — Progress Notes (Signed)
 Specialty Pharmacy Refill Coordination Note  Ronald [name] is a 48 y.o. male contacted today regarding refills of specialty medication(s) Bictegravir-Emtricitab-Tenofov (Biktarvy )   Patient requested Delivery   Delivery date: 05/21/24   Verified address: [address]  [place]   Medication will be filled on 05/20/24.

## 2024-05-28 ENCOUNTER — Other Ambulatory Visit: Payer: Self-pay

## 2024-05-28 ENCOUNTER — Other Ambulatory Visit (HOSPITAL_COMMUNITY): Payer: Self-pay

## 2024-06-10 ENCOUNTER — Other Ambulatory Visit: Payer: Self-pay

## 2024-06-14 ENCOUNTER — Other Ambulatory Visit: Payer: Self-pay

## 2024-06-16 ENCOUNTER — Other Ambulatory Visit: Payer: Self-pay

## 2024-06-16 ENCOUNTER — Other Ambulatory Visit: Payer: Self-pay | Admitting: Pharmacy Technician

## 2024-06-16 NOTE — Progress Notes (Signed)
 Specialty Pharmacy Refill Coordination Note  Ronald [name] is a 48 y.o. male contacted today regarding refills of specialty medication(s) Bictegravir-Emtricitab-Tenofov (Biktarvy )   Patient requested Delivery   Delivery date: 06/22/24   Verified address: [address]  [place]   Medication will be filled on 06/21/24.

## 2024-06-21 ENCOUNTER — Other Ambulatory Visit: Payer: Self-pay

## 2024-06-27 ENCOUNTER — Other Ambulatory Visit: Payer: Self-pay | Admitting: Orthopedic Surgery

## 2024-06-27 DIAGNOSIS — I10 Essential (primary) hypertension: Secondary | ICD-10-CM

## 2024-07-01 ENCOUNTER — Other Ambulatory Visit: Payer: Self-pay

## 2024-07-14 ENCOUNTER — Other Ambulatory Visit: Payer: Self-pay

## 2024-07-16 ENCOUNTER — Other Ambulatory Visit: Payer: Self-pay

## 2024-07-20 ENCOUNTER — Other Ambulatory Visit (HOSPITAL_COMMUNITY): Payer: Self-pay

## 2024-07-30 ENCOUNTER — Other Ambulatory Visit: Payer: Self-pay

## 2024-07-30 ENCOUNTER — Other Ambulatory Visit (HOSPITAL_COMMUNITY): Payer: Self-pay

## 2024-07-30 NOTE — Progress Notes (Signed)
 Specialty Pharmacy Refill Coordination Note  Ronald [name] is a 48 y.o. male contacted today regarding refills of specialty medication(s) Bictegravir-Emtricitab-Tenofov (Biktarvy )   Patient requested Delivery   Delivery date: 08/02/24   Verified address: [address]  [place]   Medication will be filled on: 07/30/24

## 2024-07-30 NOTE — Progress Notes (Deleted)
 Specialty Pharmacy Refill Coordination Note  Ronald [name] is a 48 y.o. male contacted today regarding refills of specialty medication(s) Bictegravir-Emtricitab-Tenofov (Biktarvy )   Patient requested Delivery   Delivery date: 08/02/24   Verified address: [address]  [place]   Medication will be filled on: 07/31/24

## 2024-07-30 NOTE — Progress Notes (Deleted)
 Specialty Pharmacy Refill Coordination Note  Baer Hinton is a 48 y.o. male contacted today regarding refills of specialty medication(s) Bictegravir-Emtricitab-Tenofov (Biktarvy )   Patient requested Delivery   Delivery date: 08/02/24   Verified address: [address]  [place]   Medication will be filled on: 07/31/24

## 2024-08-12 ENCOUNTER — Other Ambulatory Visit (HOSPITAL_COMMUNITY): Payer: Self-pay

## 2024-08-19 ENCOUNTER — Other Ambulatory Visit: Payer: Self-pay

## 2024-08-23 ENCOUNTER — Other Ambulatory Visit (HOSPITAL_COMMUNITY): Payer: Self-pay

## 2024-08-25 ENCOUNTER — Other Ambulatory Visit: Payer: Self-pay

## 2024-08-25 ENCOUNTER — Other Ambulatory Visit (HOSPITAL_COMMUNITY): Payer: Self-pay

## 2024-08-25 NOTE — Progress Notes (Signed)
 Specialty Pharmacy Refill Coordination Note  Ronald [name] is a 48 y.o. male contacted today regarding refills of specialty medication(s) Bictegravir-Emtricitab-Tenofov (Biktarvy )   Patient requested Delivery   Delivery date: 08/30/24   Verified address: [address]  [place]   Medication will be filled on: 08/27/24

## 2024-08-27 ENCOUNTER — Other Ambulatory Visit: Payer: Self-pay

## 2024-09-06 DIAGNOSIS — I10 Essential (primary) hypertension: Secondary | ICD-10-CM

## 2024-09-17 ENCOUNTER — Other Ambulatory Visit: Payer: Self-pay

## 2024-09-19 ENCOUNTER — Other Ambulatory Visit (HOSPITAL_COMMUNITY): Payer: Self-pay

## 2024-09-21 ENCOUNTER — Other Ambulatory Visit: Payer: Self-pay

## 2024-09-23 ENCOUNTER — Other Ambulatory Visit: Payer: Self-pay

## 2024-10-18 ENCOUNTER — Ambulatory Visit: Admitting: Infectious Disease
